# Patient Record
Sex: Female | Born: 1965 | Race: Black or African American | Hispanic: No | Smoking: Never smoker
Health system: Southern US, Community
[De-identification: ages and names within clinical notes are randomized; demographics above are authoritative.]

## PROBLEM LIST (undated history)

## (undated) DIAGNOSIS — N200 Calculus of kidney: Secondary | ICD-10-CM

## (undated) HISTORY — PX: ABDOMINAL HYSTERECTOMY: SHX81

---

## 1999-06-23 ENCOUNTER — Other Ambulatory Visit: Admission: RE | Admit: 1999-06-23 | Discharge: 1999-06-23 | Payer: Self-pay | Admitting: Obstetrics and Gynecology

## 1999-07-31 ENCOUNTER — Ambulatory Visit (HOSPITAL_COMMUNITY): Admission: RE | Admit: 1999-07-31 | Discharge: 1999-07-31 | Payer: Self-pay | Admitting: Obstetrics and Gynecology

## 1999-07-31 ENCOUNTER — Encounter (INDEPENDENT_AMBULATORY_CARE_PROVIDER_SITE_OTHER): Payer: Self-pay

## 2000-06-24 ENCOUNTER — Other Ambulatory Visit: Admission: RE | Admit: 2000-06-24 | Discharge: 2000-06-24 | Payer: Self-pay | Admitting: Obstetrics & Gynecology

## 2001-03-03 ENCOUNTER — Other Ambulatory Visit: Admission: RE | Admit: 2001-03-03 | Discharge: 2001-03-03 | Payer: Self-pay | Admitting: Obstetrics and Gynecology

## 2001-03-18 ENCOUNTER — Other Ambulatory Visit: Admission: RE | Admit: 2001-03-18 | Discharge: 2001-03-18 | Payer: Self-pay | Admitting: Obstetrics and Gynecology

## 2001-03-18 ENCOUNTER — Encounter (INDEPENDENT_AMBULATORY_CARE_PROVIDER_SITE_OTHER): Payer: Self-pay | Admitting: Specialist

## 2001-04-04 ENCOUNTER — Other Ambulatory Visit: Admission: RE | Admit: 2001-04-04 | Discharge: 2001-04-04 | Payer: Self-pay | Admitting: Obstetrics and Gynecology

## 2001-07-06 ENCOUNTER — Inpatient Hospital Stay (HOSPITAL_COMMUNITY): Admission: AD | Admit: 2001-07-06 | Discharge: 2001-07-06 | Payer: Self-pay | Admitting: Obstetrics and Gynecology

## 2001-07-07 ENCOUNTER — Inpatient Hospital Stay (HOSPITAL_COMMUNITY): Admission: AD | Admit: 2001-07-07 | Discharge: 2001-07-07 | Payer: Self-pay | Admitting: Obstetrics and Gynecology

## 2001-09-07 ENCOUNTER — Inpatient Hospital Stay (HOSPITAL_COMMUNITY): Admission: AD | Admit: 2001-09-07 | Discharge: 2001-09-07 | Payer: Self-pay | Admitting: Obstetrics and Gynecology

## 2001-09-13 ENCOUNTER — Inpatient Hospital Stay (HOSPITAL_COMMUNITY): Admission: AD | Admit: 2001-09-13 | Discharge: 2001-09-15 | Payer: Self-pay | Admitting: Obstetrics and Gynecology

## 2001-10-19 ENCOUNTER — Other Ambulatory Visit: Admission: RE | Admit: 2001-10-19 | Discharge: 2001-10-19 | Payer: Self-pay | Admitting: Obstetrics and Gynecology

## 2002-10-30 ENCOUNTER — Other Ambulatory Visit: Admission: RE | Admit: 2002-10-30 | Discharge: 2002-10-30 | Payer: Self-pay | Admitting: Obstetrics & Gynecology

## 2003-11-06 ENCOUNTER — Other Ambulatory Visit: Admission: RE | Admit: 2003-11-06 | Discharge: 2003-11-06 | Payer: Self-pay | Admitting: Obstetrics & Gynecology

## 2004-05-29 ENCOUNTER — Encounter: Admission: RE | Admit: 2004-05-29 | Discharge: 2004-05-29 | Payer: Self-pay | Admitting: Obstetrics and Gynecology

## 2005-06-16 ENCOUNTER — Encounter: Admission: RE | Admit: 2005-06-16 | Discharge: 2005-06-16 | Payer: Self-pay | Admitting: Obstetrics and Gynecology

## 2005-08-09 ENCOUNTER — Emergency Department: Payer: Self-pay | Admitting: Emergency Medicine

## 2006-07-22 ENCOUNTER — Encounter: Admission: RE | Admit: 2006-07-22 | Discharge: 2006-07-22 | Payer: Self-pay | Admitting: Obstetrics and Gynecology

## 2006-09-29 ENCOUNTER — Inpatient Hospital Stay (HOSPITAL_COMMUNITY): Admission: RE | Admit: 2006-09-29 | Discharge: 2006-10-02 | Payer: Self-pay | Admitting: Obstetrics and Gynecology

## 2006-09-29 ENCOUNTER — Encounter (INDEPENDENT_AMBULATORY_CARE_PROVIDER_SITE_OTHER): Payer: Self-pay | Admitting: Specialist

## 2006-10-11 ENCOUNTER — Encounter: Admission: RE | Admit: 2006-10-11 | Discharge: 2006-10-11 | Payer: Self-pay | Admitting: *Deleted

## 2007-09-22 ENCOUNTER — Encounter: Admission: RE | Admit: 2007-09-22 | Discharge: 2007-09-22 | Payer: Self-pay | Admitting: Obstetrics and Gynecology

## 2008-10-22 ENCOUNTER — Encounter: Admission: RE | Admit: 2008-10-22 | Discharge: 2008-10-22 | Payer: Self-pay | Admitting: Obstetrics and Gynecology

## 2011-02-27 NOTE — Op Note (Signed)
NAME:  Amy Cortez, Amy Cortez NO.:  [id number]   MEDICAL RECORD NO.:  [id number]          PATIENT TYPE:  INP   LOCATION:  9312                          FACILITY:  [facility]   PHYSICIAN:  Maxie Better, M.D.DATE OF BIRTH:  09-09-1966   DATE OF PROCEDURE:  09/29/2006  DATE OF DISCHARGE:                               OPERATIVE REPORT   PREOPERATIVE DIAGNOSIS:  Menorrhagia.   POSTOPERATIVE DIAGNOSIS:  Menorrhagia.   PROCEDURE:  Exploratory laparotomy, total abdominal hysterectomy.   ANESTHESIA:  General.   SURGEON:  Maxie Better, M.D.   ASSISTANT:  Genia Del, M.D.   DESCRIPTION OF PROCEDURE:  Under adequate general anesthesia, the  patient was placed in the supine position.  She was sterilely prepped  and draped in the usual fashion.  An indwelling Foley catheter was  sterilely placed.  The vagina had also been prepped.  0.25% Marcaine was  injected along the planned Pfannenstiel skin incision.  A Pfannenstiel  skin incision was then made and carried down to the rectus fascia.  The  rectus fascia was opened transversely.  The rectus fascia was then  bluntly and sharply dissected off the rectus muscle in a superior to  inferior fashion.  At the under surface of the rectus fascia superiorly,  a perforator was which was bleeding was cauterized.  Multiple  cauterization with resultant notable bleeding continued and 2-0 Vicryl  figure-of-eight suture was then placed for hemostasis.  The rectus  muscle was split in the midline.  The parietal peritoneum was entered  bluntly and extended.  The upper abdomen was explored.  Normal liver  edge was palpable. Normal kidneys.  The bowel was packed upwardly and a  self-retaining Balfour retractor was then placed. The uterus was normal  but globular.   Using Kelly clamps, the utero-ovarian ligaments were then bilaterally  clamped and traction was placed on the uterus.  No evidence of  endometriosis was noted.  The  ovaries and tubes were, otherwise, normal.  The right round ligament was then suture ligated with 0 Vicryl, cut with  cautery, with the anterior leaf of the broad ligament opened with  cautery and the posterior leaf of the broad ligament subsequently  opened.  The right utero-ovarian ligament was secondarily clamped, cut,  free tied with 0 Vicryl, and suture ligated with a 0 Vicryl.  The  uterine vessels were then skeletonized.  At that point, increased  bleeding was noted.  The pedicle was inspected.  Small bleeding along  the edges were cauterized.  The bleeding appeared to also be coming from  the ascending branches of the uterine vessel and the anterior cul-de-sac  was opened further transversely.  The bladder was then displaced  inferiorly by blunt dissection.  Below the bleeding sites of the uterine  vessels, these were doubly clamped, cut, suture ligated with 0 Vicryl.  The bleeding continued and subsequently, the cardinal ligaments were  clamped, cut and suture ligated with 0 Vicryl.  The attempts at  identifying the bleeding on the right pelvic area was seen.  Ultimately,  a bleeder was noted, clamped,  cauterized or free tied for hemostasis.   Attention was then turned to the contralateral side where the round  ligament was isolated, figure-of-eight 0 Vicryl suture was placed.  The  cautery was then used to the separate the round ligaments attachment.  The remaining anterior leaf of the broad ligament was opened  transversely and the bladder further displaced inferiorly.  The ureter  was noted on the left.  The posterior leaf of the broad ligament was  opened.  The utero-ovarian ligament on the left was then doubly clamped,  cut, free tied with 0 Vicryl figure-of-eight suture was then placed.  At  that point, the same issue of bleeding was then also noted. The uterine  vessels were clamped, cut and suture ligated with 0 Vicryl.  A second  clamp was then placed, cut, and suture  ligated with 0 Vicryl.  Bleeding  again was noted. The pedicle was retracted from the clamp on the left  and a suture was placed and this stitch secured. It was then noted that  pedicle was not also hemostased at that point, careful clamping was then  performed in order to secure bleeders and at that point the left ureter  was checked on again.  The left retroperitoneal space was opened with  the cautery and bleeding started.  Continued dissection in the  retroperitoneal space was then carefully done. The ureter was not  clearly seen on the medial border of the peritoneum but could be  palpated deep in the pelvis.  At that point, it was felt secure to ligate the bleeding vessels for  better exposure. At that point, the fundus of the uterus was truncated  from its attachment to the cervix and the truncated cervix was then  grasped and the bladder was pushed further inferiorly.  The uterosacral  ligaments was finally identified bilaterally, then they were both  clamped, cut and suture ligated with 0 Vicryl.  The cardinal ligaments  were then continued to be serially clamped, cut, intermittent bleeding  here and there, from every site that had been touched continued to  bleed.  At that point, a CBC and a DIC panel was obtained  intraoperatively and hold clot was also ordered. The cardinal ligaments  were then serially clamped, cut, and suture ligated with 0 Vicryl until  the cervicovaginal junction was then reached at which point the curved  Heaney was then placed across the cervicovaginal junction and the cervix  was then severed from its attachment.  The angle sutures were then  secured with Heaney suture of 0 Vicryl.  The vaginal cuff was bleeding  and 0 Vicryl running locked stitch was placed posteriorly and anteriorly  on the cuff.  Additional bleeders were still noted despite the reefing  of the cuff and additional individual 0 Vicryl figure-of-eight sutures were then placed at both  sites.  Once this was secured, there was some  bleeding noted on the bladder surface which were carefully cauterized or  clamped and suture ligated with 3-0 Vicryl suture.  The left aspect of  the bladder was noted to be tucked up near the left vaginal cuff at  which time that was separated.The bladder was inspected and was normal.  Bleeding from the vaginal cuff was encountered and eventually the  bleeding was secured.  The vaginal cuff was then closed with interrupted  0 Vicryl figure-of-eight sutures.  The ureter was once again  reidentified and continued to be peristalsing. Indigo carmine was  injected intraoperatively  during the time of the recessed portion of the  pedicle on the left. No intraabdominal dye was noted. The  retroperitoneal space on the left was reinspected for bleeders.  There  was small bleeding along  the edge the peritoneal surface at which time  a free tie of 0 Vicryl was performed.  The small bleeder that was noted  earlier near the pedicle where the uterine vessels was also noted to be  still slightly bleeding and, therefore, was isolated and re-clamped and  ligated.  Good hemostasis was finally accomplished.  The uterosacral was  then attached to the angle of the vaginal cuff bilaterally and the right  ovarian pedicle was suspended with the right round ligament, the left  ovary was not suspended.   The abdomen was then irrigated and suctioned of debris.  Reinspection  showed good hemostasis.  Intercede was then placed overlying the vaginal  cuff. The packings were then removed. The self-retaining retractor was  removed.  The parietal peritoneum was not closed.  The under surface of  the rectus fascia was inspected and good hemostasis noted.  The rectus  fascia was closed with 0 Vicryl x2.  The subcutaneous area was  irrigated, small bleeders cauterized, and 2-0 plain suture was then  placed interrupted. The skin was then approximating with Ethicon  staples.   The specimen was uterus with cervix sent to pathology.  Estimated blood loss was 1100 mL.  Intraoperative fluid was 3100 mL  crystalloid.  Urine output was 600 mL of urine, some of which was blue  as a result of indigo carmine. Sponge and instrument counts x2 was  correct.  There were no complications.  The patient tolerated the  procedure well and was transferred to recovery in stable condition.     Maxie Better, M.D.  Electronically Signed    [place]/MEDQ  D:  09/29/2006  T:  09/29/2006  Job:  [id number]

## 2011-02-27 NOTE — Discharge Summary (Signed)
NAME:  Amy Cortez, Amy Cortez NO.:  [id number]   MEDICAL RECORD NO.:  [id number]          PATIENT TYPE:  INP   LOCATION:  9312                          FACILITY:  [facility]   PHYSICIAN:  Maxie Better, M.D.DATE OF BIRTH:  1966-07-23   DATE OF ADMISSION:  09/29/2006  DATE OF DISCHARGE:  10/02/2006                               DISCHARGE SUMMARY   ADMISSION DIAGNOSIS:  Menorrhagia.   DISCHARGE DIAGNOSIS:  Menorrhagia, leiomyoma.   HOSPITAL COURSE:  The patient is a 45 year old gravida 2, para 2 female  who was admitted on September 29, 2006 to [facility] for  exploratory laparotomy, total abdominal hysterectomy.  The patient  underwent a total abdominal hysterectomy complicated by blood loss.  The  patient had postoperative anemia, mild, for which she remained  asymptomatic throughout her course.  The patient has slow bowel  function; however, by postoperative day #3, she had a bowel movement,  passed flatus, is tolerating a regular diet.  Incision showed no  evidence of an infection.  Her pathology was consistent with a benign  leiomyoma, 108 gm uterus.  The patient had a CBC on postop day #1 that  showed a hemoglobin of 9.3, hematocrit 26.3, white count 15, platelet  count 247,000.  Due to increased bleeding, the patient had a DIC panel  done in the operating room, which is notable for a low-normal  fibrinogen.  The patient is deemed well enough to be discharged home.   DISPOSITION:  Home.   CONDITION:  Stable.   DISCHARGE INSTRUCTIONS:  Call for temperature greater than or equal to  100.4, nothing per vagina for 4-6 weeks.  No heavy lifting or driving  for two weeks.  Call for severe abdominal pain, nausea or vomiting.  Call for increased incisional pain, drainage, or increased pain from the  incision site.   DISCHARGE MEDICATIONS:  1. Tylox #30 1-2 tablets every 3-4 hours p.r.n. pain.  2. Motrin 800 mg p.o. q.6h. p.r.n. pain.  3. Nu-Iron 150 mg p.o.  b.i.d.   FOLLOW-UP APPOINTMENTS:  [facility] in six weeks.  Staples to be  removed in the office.      Maxie Better, M.D.  Electronically Signed     [place]/MEDQ  D:  10/02/2006  T:  10/02/2006  Job:  [id number]

## 2011-02-27 NOTE — H&P (Signed)
[facility]  Patient:    Amy Cortez, BIRKEL Visit Number: [id number] MRN: [id number]          Service Type: Attending:  Sheronette A. Cherly Hensen, M.D. Dictated by:   Sheria Lang. Cherly Hensen, M.D. Adm. Date:  09/12/01                           History and Physical  CHIEF COMPLAINT:              Rapid labor.  HISTORY OF PRESENT ILLNESS:   This is a 45 year old gravida 2 para 1, 0-0-1, female with an LMP of December 20, 2000, [facility] of September 27, 2001, now at 38+ weeks gestation, admitted for induction of labor secondary to prior rapid labor.  The patients prenatal course has been notable for preterm cervical change, for which the patient was placed on bed rest.  She was given betamethasone for acceleration of fetal lung maturity.  The betamethasone was given July 07, 2001 through July 08, 2001.  Last examination was notable for the patient being about 3 cm, 50%, -2 vertex presentation.  Her last delivery was in December 1996, at which time she had a two hour labor without much sensation of contractions.  The patient has had persistent size greater than dates, with a normal one hour GCT and an ultrasound that consistently showed normal amniotic fluid and average gestational age for the fetus.  Prenatal care was at [facility].  Primary obstetrician is Sheronette A. Cherly Hensen, M.D.  Blood type is A-positive.  Antibody screen is negative.  Hemoglobin electrophoresis was negative.  RPR nonreactive.  Rubella immune.  Hepatitis B surface antigen negative.  HIV test nonreactive.  GC and Chlamydia cultures negative.  Pap smear normal.  AFP-3, patient declined. Sonogram on July 30, 202 at 20.3 weeks showed normal anatomic fetal survey and placenta that was posterior.  Normal one hour GCT.  The patient declined amniocentesis for advanced maternal age.  Ultrasound on July 27, 2001 for size greater than dates showed estimated fetal weight of 1727 g, which was  at the 36th percentile, normal amniotic fluid index.  ALLERGIES:                    No known drug allergies.  MEDICATIONS:                  Prenatal vitamins.  PAST MEDICAL HISTORY:         Fibroid uterus.  PAST SURGICAL HISTORY:        Endometriosis in October 2002.  PAST OBSTETRICAL HISTORY:     December 1996, 6 pound 7 ounce baby, two hour labor, vaginal delivery.  FAMILY HISTORY:               Maternal grandfather died of colon cancer. Diabetes in maternal grandmother.  SOCIAL HISTORY:               Married.  Nonsmoker.  Data financial.  One child.  REVIEW OF SYSTEMS:            Negative except for irregular contractions and pelvic pressure.  PHYSICAL EXAMINATION:  GENERAL:                      Gravid black female, in no acute distress.  VITAL SIGNS:                  Blood pressure 120/70.  SKIN:  No lesions.  HEENT:                        Sclerae anicteric.  Conjunctivae pink. Oropharynx negative.  HEART:                        Regular rate and rhythm without murmur.  LUNGS:                        Clear to auscultation.  BREAST:                       Soft, nontender.  No palpable mass.  ABDOMEN:                      Gravid.  Fundal height 41 cm.  PELVIC:                       Examination as per  HPI.  IMPRESSION:                   1. Term gestation.                               2. History of fast labor.                               3. Group B streptococci culture positive.  PLAN:                         1. Admission.                               2. Pitocin induction.                               3. Penicillin prophylaxis.                               4. Amniotomy p.r.n.                               5. Epidural p.r.n. Dictated by:   Sheria Lang. Cherly Hensen, M.D. Attending:  Sheronette A. Cherly Hensen, M.D. DD:  09/12/01 TD:  09/13/01 Job: [id number]

## 2011-02-27 NOTE — Consult Note (Signed)
[facility]  Patient:    Amy Cortez, AREOLA Visit Number: [id number] MRN: [id number]          Service Type: OBS Location: [facility] Attending Physician:  Lenoard Aden Dictated by:   Lenoard Aden, M.D. Proc. Date: 07/07/01 Admit Date:  07/07/2001   CC:         [facility]   Consultation Report  ER CONSULTATION NOTE  CHIEF COMPLAINT:  Decreased fetal movement.  HISTORY OF PRESENT ILLNESS:  Patient is a 45 year old African-American female, G2, P1, EDD September 27, 2001, at 28+ weeks gestation who reports decreased fetal movement.  She reports no leakage of fluid, no regular contractions, and no bleeding.  PAST MEDICAL HISTORY:  Remarkable for uncomplicated vaginal delivery in 1996, No known drug allergies.  Medications - prenatal vitamins.  History of myomectomy in October 2001 and endometriosis in October 2001.  History of kidney stones in 1992.  Family history of cardiovascular disease, diabetes, and chronic hypertension.  GYN REVIEW OF SYSTEMS:  Otherwise negative.  Patient denies domestic or physical violence.  Nonsmoker, nondrinker.  PRENATAL LAB DATA:  Blood type A positive, Rh antibody negative.  Rubella immune.  Hepatitis B surface antigen negative.  HIV nonreactive.  GC, Chlamydia negative.  PRENATAL LAB COURSE:  Uncomplicated at this time except for preterm cervical change and patient is for second dose of steroid therapy today.  PHYSICAL EXAMINATION:  GENERAL:  She is a well-developed, well-nourished black female in no apparent distress.  VITAL SIGNS:  Stable.  Patient is afebrile.  HEENT:  Normal.  LUNGS:  Clear.  HEART:  Regular rhythm.  ABDOMEN:  Soft, gravid, nontender.  PELVIC:  Exam is deferred to the office where it was performed yesterday revealing the cervix to be 1 cm dilated, 50% vertex, -2.  NST today is reactive with a baseline in the 140s and multiple accelerations in the 150 to 160 per minute  range.  No contractions are noted.  EXTREMITIES:  No cords.  NEUROLOGIC:  Nonfocal.  IMPRESSION: 1. A 28-week intrauterine pregnancy. 2. Preterm cervical change. 3. Reassuring fetal heart rate surveillance with reactive NST and no    contractions.  PLAN:  Betamethasone 12.5 mg IM, second dose given.  Fetal activity discussed, reassurance given.  Follow up in the office within one week.  Strict preterm labor warnings and bed rest discussed. Dictated by:   Lenoard Aden, M.D. Attending Physician:  Lenoard Aden DD:  07/07/01 TD:  07/07/01 Job: [id number]

## 2016-01-21 ENCOUNTER — Emergency Department (HOSPITAL_COMMUNITY)
Admission: EM | Admit: 2016-01-21 | Discharge: 2016-01-22 | Disposition: A | Discharge status: Home / Self Care | Payer: Managed Care, Other (non HMO) | Attending: Emergency Medicine | Admitting: Emergency Medicine

## 2016-01-21 ENCOUNTER — Encounter (HOSPITAL_COMMUNITY): Payer: Self-pay | Admitting: *Deleted

## 2016-01-21 DIAGNOSIS — R0789 Other chest pain: Secondary | ICD-10-CM | POA: Insufficient documentation

## 2016-01-21 DIAGNOSIS — R112 Nausea with vomiting, unspecified: Secondary | ICD-10-CM | POA: Insufficient documentation

## 2016-01-21 DIAGNOSIS — R42 Dizziness and giddiness: Secondary | ICD-10-CM | POA: Insufficient documentation

## 2016-01-21 DIAGNOSIS — Z87442 Personal history of urinary calculi: Secondary | ICD-10-CM | POA: Insufficient documentation

## 2016-01-21 DIAGNOSIS — R0602 Shortness of breath: Secondary | ICD-10-CM | POA: Insufficient documentation

## 2016-01-21 HISTORY — DX: Calculus of kidney: N20.0

## 2016-01-21 LAB — COMPREHENSIVE METABOLIC PANEL
ALT: 16 U/L (ref 14–54)
AST: 25 U/L (ref 15–41)
Albumin: 3.8 g/dL (ref 3.5–5.0)
Alkaline Phosphatase: 59 U/L (ref 38–126)
Anion gap: 10 (ref 5–15)
BILIRUBIN TOTAL: 0.8 mg/dL (ref 0.3–1.2)
CO2: 25 mmol/L (ref 22–32)
CREATININE: 0.72 mg/dL (ref 0.44–1.00)
Calcium: 9.3 mg/dL (ref 8.9–10.3)
Chloride: 108 mmol/L (ref 101–111)
GFR calc Af Amer: >60 mL/min (ref >60)
Glucose, Bld: 102 mg/dL — ABNORMAL HIGH (ref 65–99)
POTASSIUM: 3.9 mmol/L (ref 3.5–5.1)
Sodium: 143 mmol/L (ref 135–145)
Total Protein: 7.5 g/dL (ref 6.5–8.1)

## 2016-01-21 LAB — URINALYSIS, ROUTINE W REFLEX MICROSCOPIC
BILIRUBIN URINE: NEGATIVE
Glucose, UA: NEGATIVE mg/dL
HGB URINE DIPSTICK: NEGATIVE
Ketones, ur: NEGATIVE mg/dL
Leukocytes, UA: NEGATIVE
Nitrite: NEGATIVE
Protein, ur: NEGATIVE mg/dL
SPECIFIC GRAVITY, URINE: 1.013 (ref 1.005–1.030)
pH: 7.5 (ref 5.0–8.0)

## 2016-01-21 LAB — LIPASE, BLOOD: Lipase: 22 U/L (ref 11–51)

## 2016-01-21 LAB — CBC
HCT: 38.6 % (ref 36.0–46.0)
HEMOGLOBIN: 12.6 g/dL (ref 12.0–15.0)
MCH: 29.6 pg (ref 26.0–34.0)
MCHC: 32.6 g/dL (ref 30.0–36.0)
MCV: 90.8 fL (ref 78.0–100.0)
PLATELETS: 208 10*3/uL (ref 150–400)
RBC: 4.25 MIL/uL (ref 3.87–5.11)
RDW: 13.6 % (ref 11.5–15.5)
WBC: 6.5 10*3/uL (ref 4.0–10.5)

## 2016-01-21 LAB — I-STAT TROPONIN, ED: TROPONIN I, POC: 0 ng/mL (ref 0.00–0.08)

## 2016-01-21 NOTE — ED Provider Notes (Signed)
CSN: [id number]     Arrival date & time 01/21/16  1538 History   By signing my name below, I, Amy Cortez, attest that this documentation has been prepared under the direction and in the presence of Shon Batonourtney F Johari Pinney, MD.  Electronically Signed: Arlan OrganAshley Cortez, ED Scribe. 01/22/2016. 12:12 AM.   Chief Complaint  Patient presents with  . Abdominal Pain  . Chest Pain   The history is provided by the patient. No language interpreter was used.    HPI Comments: Amy Cortez brought in by EMS is a 50 y.o. female without any pertinent past medical history who presents to the Emergency Department complaining of intermittent, ongoing L sided chest pain x 2 weeks. Pain is described as pressure/dull. No aggravating or alleviating factors. Pt also reports intermittent shortness of breath x few months. She reports intermittent, ongoing nausea, vomiting, and dizziness x 1 day. Pt states she ate some [employer] chicken for lunch at approximately 12:30 PM and experienced several episode of vomiting afterwards. No OTC medications or home remedies attempted prior to arrival. No recent fever, chills, or abdominal pain. Last cardiac stress test in December of 2016 And per the patient was normal. Ms. Amy Cortez admits her daughter was recently sick at home with Flu like symptoms. Pt with known allergy to Celebrex.  PCP: No PCP Per Patient    Past Medical History  Diagnosis Date  . Kidney stones    Past Surgical History  Procedure Laterality Date  . Abdominal hysterectomy     No family history on file. Social History  Substance Use Topics  . Smoking status: Never Smoker   . Smokeless tobacco: None  . Alcohol Use: No   OB History    No data available     Review of Systems  Constitutional: Negative for fever and chills.  Respiratory: Positive for shortness of breath.   Cardiovascular: Positive for chest pain.  Gastrointestinal: Positive for nausea and vomiting. Negative for abdominal pain.   Neurological: Positive for dizziness. Negative for headaches.  All other systems reviewed and are negative.     Allergies  Celebrex  Home Medications   Prior to Admission medications   Medication Sig Start Date End Date Taking? Authorizing Provider  ondansetron (ZOFRAN ODT) 4 MG disintegrating tablet Take 1 tablet (4 mg total) by mouth every 8 (eight) hours as needed for nausea or vomiting. 01/22/16   Shon Batonourtney F Dajai Wahlert, MD   Triage Vitals: BP 116/66 mmHg  Pulse 77  Temp(Src) 97.6 F (36.4 C) (Oral)  Resp 16  Ht 5' 5.5" (1.664 m)  SpO2 100%   Physical Exam  Constitutional: She is oriented to person, place, and time. She appears well-developed and well-nourished. No distress.  HENT:  Head: Normocephalic and atraumatic.  Eyes: EOM are normal. Pupils are equal, round, and reactive to light.  Neck: Neck supple.  Cardiovascular: Normal rate, regular rhythm and normal heart sounds.   No murmur heard. Pulmonary/Chest: Effort normal and breath sounds normal. No respiratory distress. She has no wheezes.  Abdominal: Soft. Bowel sounds are normal. There is no tenderness. There is no rebound.  Neurological: She is alert and oriented to person, place, and time.  Cranial nerves II through XII intact, 5 out of 5 strength in all 4 extremities, no dysmetria to finger-nose-finger  Skin: Skin is warm and dry.  Psychiatric: She has a normal mood and affect.  Nursing note and vitals reviewed.   ED Course  Procedures (including critical care time)  DIAGNOSTIC STUDIES: Oxygen Saturation is 100% on RA, Normal by my interpretation.    COORDINATION OF CARE: 12:04 AM- Will order imaging, blood work, urinalysis, and EKG. Will give fluids and Zofran. Discussed treatment plan with pt at bedside and pt agreed to plan.     Labs Review Labs Reviewed  COMPREHENSIVE METABOLIC PANEL - Abnormal; Notable for the following:    Glucose, Bld 102 (*)    BUN <5 (*)    All other components within normal  limits  LIPASE, BLOOD  CBC  URINALYSIS, ROUTINE W REFLEX MICROSCOPIC (NOT AT [facility])  D-DIMER, QUANTITATIVE (NOT AT [facility])  Rosezena Sensor, ED  Rosezena Sensor, ED    Imaging Review Dg Abd Acute W/chest  01/22/2016  CLINICAL DATA:  Vomiting, weakness and lightheadedness today. EXAM: DG ABDOMEN ACUTE W/ 1V CHEST COMPARISON:  None. FINDINGS: There is no evidence of dilated bowel loops or free intraperitoneal air. No radiopaque calculi or other significant radiographic abnormality is seen. Heart size and mediastinal contours are within normal limits. Both lungs are clear. IMPRESSION: Negative abdominal radiographs.  No acute cardiopulmonary disease. Electronically Signed   By: Ellery Plunk M.D.   On: 01/22/2016 01:22   I have personally reviewed and evaluated these images and lab results as part of my medical decision-making.   EKG Interpretation   Date/Time:  Tuesday January 21 2016 15:55:11 EDT Ventricular Rate:  68 PR Interval:  156 QRS Duration: 80 QT Interval:  408 QTC Calculation: 433 R Axis:   30 Text Interpretation:  Normal sinus rhythm Cannot rule out Anterior infarct  , age undetermined Abnormal ECG No prior for comparison Confirmed by  Fielding Mault  MD, Toni Amend ([id number]) on 01/21/2016 11:51:16 PM      MDM   Final diagnoses:  Non-intractable vomiting with nausea, vomiting of unspecified type  Dizziness  Other chest pain    Patient presents with multiple complaints. Dizziness and vomiting earlier today after eating lunch. Multiple weeks of chest pain. Reports recent cardiac stress test was reassuring. She is low risk for ACS.  EKG shows no evidence of acute ischemia. Chest x-ray is reassuring. D-dimer is negative. Troponin 2 is reassuring. Patient does not appear orthostatic; however, given the vomiting, she could be mildly dehydrated causing some dizziness. She is nonfocal and doubt central cause of vertigo. On recheck, patient states she feels improved after hydration. She  is able to tolerate fluids without difficulty. Discussed with patient follow-up with primary physician and cardiology if chest pain persists.  After history, exam, and medical workup I feel the patient has been appropriately medically screened and is safe for discharge home. Pertinent diagnoses were discussed with the patient. Patient was given return precautions.  I personally performed the services described in this documentation, which was scribed in my presence. The recorded information has been reviewed and is accurate.   Shon Baton, MD 01/22/16 [phone]

## 2016-01-21 NOTE — ED Notes (Signed)
[facility], per report pt has had intermittent L sided CP onset x 2 wks, pt c/o n/v, & dizziness onset x 3 hrs, pt A&O x 4, follows commands, speaks in complete sentences

## 2016-01-22 ENCOUNTER — Emergency Department (HOSPITAL_COMMUNITY): Payer: Managed Care, Other (non HMO)

## 2016-01-22 LAB — D-DIMER, QUANTITATIVE: D-Dimer, Quant: 0.35 ug/mL-FEU (ref 0.00–0.50)

## 2016-01-22 LAB — I-STAT TROPONIN, ED: TROPONIN I, POC: 0 ng/mL (ref 0.00–0.08)

## 2016-01-22 MED ORDER — ONDANSETRON HCL 4 MG/2ML IJ SOLN
4.0000 mg | Freq: Once | INTRAMUSCULAR | Status: AC
  Administered 2016-01-22: 4 mg via INTRAVENOUS
  Filled 2016-01-22: qty 2

## 2016-01-22 MED ORDER — SODIUM CHLORIDE 0.9 % IV BOLUS (SEPSIS)
1000.0000 mL | Freq: Once | INTRAVENOUS | Status: AC
  Administered 2016-01-22: 1000 mL via INTRAVENOUS

## 2016-01-22 MED ORDER — ONDANSETRON 4 MG PO TBDP: 4.0000 mg | ORAL_TABLET | Freq: Three times a day (TID) | ORAL | Status: DC | PRN

## 2016-01-22 NOTE — Discharge Instructions (Signed)
You were seen today for multiple complaints including nausea, vomiting, dizziness, and chest pain. Your workup is reassuring. You need follow-up with her primary physician and cardiology if symptoms persist.   Nonspecific Chest Pain  Chest pain can be caused by many different conditions. There is always a chance that your pain could be related to something serious, such as a heart attack or a blood clot in your lungs. Chest pain can also be caused by conditions that are not life-threatening. If you have chest pain, it is very important to follow up with your health care provider. CAUSES  Chest pain can be caused by:  Heartburn.  Pneumonia or bronchitis.  Anxiety or stress.  Inflammation around your heart (pericarditis) or lung (pleuritis or pleurisy).  A blood clot in your lung.  A collapsed lung (pneumothorax). It can develop suddenly on its own (spontaneous pneumothorax) or from trauma to the chest.  Shingles infection (varicella-zoster virus).  Heart attack.  Damage to the bones, muscles, and cartilage that make up your chest wall. This can include:  Bruised bones due to injury.  Strained muscles or cartilage due to frequent or repeated coughing or overwork.  Fracture to one or more ribs.  Sore cartilage due to inflammation (costochondritis). RISK FACTORS  Risk factors for chest pain may include:  Activities that increase your risk for trauma or injury to your chest.  Respiratory infections or conditions that cause frequent coughing.  Medical conditions or overeating that can cause heartburn.  Heart disease or family history of heart disease.  Conditions or health behaviors that increase your risk of developing a blood clot.  Having had chicken pox (varicella zoster). SIGNS AND SYMPTOMS Chest pain can feel like:  Burning or tingling on the surface of your chest or deep in your chest.  Crushing, pressure, aching, or squeezing pain.  Dull or sharp pain that is  worse when you move, cough, or take a deep breath.  Pain that is also felt in your back, neck, shoulder, or arm, or pain that spreads to any of these areas. Your chest pain may come and go, or it may stay constant. DIAGNOSIS Lab tests or other studies may be needed to find the cause of your pain. Your health care provider may have you take a test called an ambulatory ECG (electrocardiogram). An ECG records your heartbeat patterns at the time the test is performed. You may also have other tests, such as:  Transthoracic echocardiogram (TTE). During echocardiography, sound waves are used to create a picture of all of the heart structures and to look at how blood flows through your heart.  Transesophageal echocardiogram (TEE).This is a more advanced imaging test that obtains images from inside your body. It allows your health care provider to see your heart in finer detail.  Cardiac monitoring. This allows your health care provider to monitor your heart rate and rhythm in real time.  Holter monitor. This is a portable device that records your heartbeat and can help to diagnose abnormal heartbeats. It allows your health care provider to track your heart activity for several days, if needed.  Stress tests. These can be done through exercise or by taking medicine that makes your heart beat more quickly.  Blood tests.  Imaging tests. TREATMENT  Your treatment depends on what is causing your chest pain. Treatment may include:  Medicines. These may include:  Acid blockers for heartburn.  Anti-inflammatory medicine.  Pain medicine for inflammatory conditions.  Antibiotic medicine, if an infection is  present.  Medicines to dissolve blood clots.  Medicines to treat coronary artery disease.  Supportive care for conditions that do not require medicines. This may include:  Resting.  Applying heat or cold packs to injured areas.  Limiting activities until pain decreases. HOME CARE  INSTRUCTIONS  If you were prescribed an antibiotic medicine, finish it all even if you start to feel better.  Avoid any activities that bring on chest pain.  Do not use any tobacco products, including cigarettes, chewing tobacco, or electronic cigarettes. If you need help quitting, ask your health care provider.  Do not drink alcohol.  Take medicines only as directed by your health care provider.  Keep all follow-up visits as directed by your health care provider. This is important. This includes any further testing if your chest pain does not go away.  If heartburn is the cause for your chest pain, you may be told to keep your head raised (elevated) while sleeping. This reduces the chance that acid will go from your stomach into your esophagus.  Make lifestyle changes as directed by your health care provider. These may include:  Getting regular exercise. Ask your health care provider to suggest some activities that are safe for you.  Eating a heart-healthy diet. A registered dietitian can help you to learn healthy eating options.  Maintaining a healthy weight.  Managing diabetes, if necessary.  Reducing stress. SEEK MEDICAL CARE IF:  Your chest pain does not go away after treatment.  You have a rash with blisters on your chest.  You have a fever. SEEK IMMEDIATE MEDICAL CARE IF:   Your chest pain is worse.  You have an increasing cough, or you cough up blood.  You have severe abdominal pain.  You have severe weakness.  You faint.  You have chills.  You have sudden, unexplained chest discomfort.  You have sudden, unexplained discomfort in your arms, back, neck, or jaw.  You have shortness of breath at any time.  You suddenly start to sweat, or your skin gets clammy.  You feel nauseous or you vomit.  You suddenly feel light-headed or dizzy.  Your heart begins to beat quickly, or it feels like it is skipping beats. These symptoms may represent a serious  problem that is an emergency. Do not wait to see if the symptoms will go away. Get medical help right away. Call your local emergency services (911 in the [place] Do not drive yourself to the hospital.   This information is not intended to replace advice given to you by your health care provider. Make sure you discuss any questions you have with your health care provider.   Document Released: 07/08/2005 Document Revised: 10/19/2014 Document Reviewed: 05/04/2014 Elsevier Interactive Patient Education 2016 Else[employer] Inc[employer]   Nausea and Vomiting Nausea is a sick feeling that often comes before throwing up (vomiting). Vomiting is a reflex where stomach contents come out of your mouth. Vomiting can cause severe loss of body fluids (dehydration). Children and elderly adults can become dehydrated quickly, especially if they also have diarrhea. Nausea and vomiting are symptoms of a condition or disease. It is important to find the cause of your symptoms. CAUSES   Direct irritation of the stomach lining. This irritation can result from increased acid production (gastroesophageal reflux disease), infection, food poisoning, taking certain medicines (such as nonsteroidal anti-inflammatory drugs), alcohol use, or tobacco use.  Signals from the brain.These signals could be caused by a headache, heat exposure, an inner ear disturbance, increased pressure  in the brain from injury, infection, a tumor, or a concussion, pain, emotional stimulus, or metabolic problems.  An obstruction in the gastrointestinal tract (bowel obstruction).  Illnesses such as diabetes, hepatitis, gallbladder problems, appendicitis, kidney problems, cancer, sepsis, atypical symptoms of a heart attack, or eating disorders.  Medical treatments such as chemotherapy and radiation.  Receiving medicine that makes you sleep (general anesthetic) during surgery. DIAGNOSIS Your caregiver may ask for tests to be done if the problems do not improve  after a few days. Tests may also be done if symptoms are severe or if the reason for the nausea and vomiting is not clear. Tests may include:  Urine tests.  Blood tests.  Stool tests.  Cultures (to look for evidence of infection).  X-rays or other imaging studies. Test results can help your caregiver make decisions about treatment or the need for additional tests. TREATMENT You need to stay well hydrated. Drink frequently but in small amounts.You may wish to drink water, sports drinks, clear broth, or eat frozen ice pops or gelatin dessert to help stay hydrated.When you eat, eating slowly may help prevent nausea.There are also some antinausea medicines that may help prevent nausea. HOME CARE INSTRUCTIONS   Take all medicine as directed by your caregiver.  If you do not have an appetite, do not force yourself to eat. However, you must continue to drink fluids.  If you have an appetite, eat a normal diet unless your caregiver tells you differently.  Eat a variety of complex carbohydrates (rice, wheat, potatoes, bread), lean meats, yogurt, fruits, and vegetables.  Avoid high-fat foods because they are more difficult to digest.  Drink enough water and fluids to keep your urine clear or pale yellow.  If you are dehydrated, ask your caregiver for specific rehydration instructions. Signs of dehydration may include:  Severe thirst.  Dry lips and mouth.  Dizziness.  Dark urine.  Decreasing urine frequency and amount.  Confusion.  Rapid breathing or pulse. SEEK IMMEDIATE MEDICAL CARE IF:   You have blood or brown flecks (like coffee grounds) in your vomit.  You have black or bloody stools.  You have a severe headache or stiff neck.  You are confused.  You have severe abdominal pain.  You have chest pain or trouble breathing.  You do not urinate at least once every 8 hours.  You develop cold or clammy skin.  You continue to vomit for longer than 24 to 48  hours.  You have a fever. MAKE SURE YOU:   Understand these instructions.  Will watch your condition.  Will get help right away if you are not doing well or get worse.   This information is not intended to replace advice given to you by your health care provider. Make sure you discuss any questions you have with your health care provider.   Document Released: 09/28/2005 Document Revised: 12/21/2011 Document Reviewed: 02/25/2011 Elsevier Interactive Patient Education [employer]

## 2016-01-22 NOTE — ED Notes (Signed)
Pt stable, ambulatory, states understanding of discharge instructions 

## 2016-01-23 ENCOUNTER — Telehealth: Payer: Self-pay | Admitting: *Deleted

## 2016-01-23 NOTE — Telephone Encounter (Signed)
Patient recently seen in ED for nausea, vomiting, and chest pain She called for follow up appointment which was made by scheduling for May Spoke with patient and she stated she had been having chest pains off and on for a month or so Per ED note and patient she had a normal stress test end of 2016 at Dr Verl DickerGanji's office  Patient denies any history of HTN, diabetes, elevated cholesterol, or heart disease. No family h/o heart disease Patient stated several times she thought pain just secondary to stress and was feeling fine now Did recommend patient getting record from Dr Verl DickerGanji's office sent over  Patient stated she would call them and call back with what they told her regarding process of getting records

## 2016-02-21 ENCOUNTER — Encounter: Payer: Managed Care, Other (non HMO) | Admitting: Cardiovascular Disease

## 2016-02-21 NOTE — Progress Notes (Signed)
Patient ID: Amy Cortez, female   DOB: 1966-01-13, 50 y.o.   MRN: [id number] CARDIOLOGY CONSULT NOTE       Patient ID: Amy DoffingCora D Cortez MRN: [id number] DOB/AGE: 1966-01-13 49 y.o.  Admit date: (Not on file) Referring Physician:  Marcy SalvoBridgette Allison  Primary Physician: No PCP Per Patient Primary Cardiologist: New Reason for Consultation: Chest Pain  Active Problems:   * No active hospital problems. *   HPI:  50 y.o. referred for chest pain.  Seen by [facility] 02/04/16.  Seen by them October 2016 after URI. Normal ETT and thought to have chostrochondritisSeen in ER 01/21/16 upper abdominal pain And left sided chest pain for two weeks with associated nausea/vomiting.  Initiated by eating fried chicken with subsequent vomiting R/O and D/c home Still with fatigue Reviewed ER records and CXR NAD, troponin negative, normal d dimer and lipase and WBC.  01/22/16 Abdominal films non acute Has not had GBUS.    ROS All other systems reviewed and negative except as noted above  Past Medical History  Diagnosis Date  . Kidney stones     No family history on file.  Social History   Social History  . Marital Status: Married    Spouse Name: N/A  . Number of Children: N/A  . Years of Education: N/A   Occupational History  . Not on file.   Social History Main Topics  . Smoking status: Never Smoker   . Smokeless tobacco: Not on file  . Alcohol Use: No  . Drug Use: No  . Sexual Activity: Not on file   Other Topics Concern  . Not on file   Social History Narrative  . No narrative on file    Past Surgical History  Procedure Laterality Date  . Abdominal hysterectomy          Physical Exam: There were no vitals taken for this visit.      Labs:   Lab Results  Component Value Date   WBC 6.5 01/21/2016   HGB 12.6 01/21/2016   HCT 38.6 01/21/2016   MCV 90.8 01/21/2016   PLT 208 01/21/2016   No results for input(s): NA, K, CL, CO2, BUN, CREATININE, CALCIUM,  PROT, BILITOT, ALKPHOS, ALT, AST, GLUCOSE in the last 168 hours.  Invalid input(s): LABALBU No results found for: CKTOTAL, CKMB, CKMBINDEX, TROPONINI No results found for: CHOL No results found for: HDL No results found for: LDLCALC No results found for: TRIG No results found for: CHOLHDL No results found for: LDLDIRECT    Radiology: No results found.  EKG: SR rate 57 normal    ASSESSMENT AND PLAN:    Signed: Charlton Hawseter Bronislaus Verdell 02/21/2016, 9:31 AM    This encounter was created in error - please disregard.

## 2016-02-24 ENCOUNTER — Encounter: Payer: Self-pay | Admitting: Cardiovascular Disease

## 2017-10-25 ENCOUNTER — Other Ambulatory Visit: Payer: Self-pay

## 2017-10-25 ENCOUNTER — Encounter (HOSPITAL_COMMUNITY): Payer: Self-pay

## 2017-10-25 ENCOUNTER — Emergency Department (HOSPITAL_COMMUNITY): Payer: 59

## 2017-10-25 ENCOUNTER — Emergency Department (HOSPITAL_COMMUNITY)
Admission: EM | Admit: 2017-10-25 | Discharge: 2017-10-25 | Disposition: A | Discharge status: Home / Self Care | Payer: 59 | Attending: Emergency Medicine | Admitting: Emergency Medicine

## 2017-10-25 DIAGNOSIS — Z79899 Other long term (current) drug therapy: Secondary | ICD-10-CM | POA: Diagnosis not present

## 2017-10-25 DIAGNOSIS — R079 Chest pain, unspecified: Secondary | ICD-10-CM | POA: Insufficient documentation

## 2017-10-25 DIAGNOSIS — R0602 Shortness of breath: Secondary | ICD-10-CM | POA: Insufficient documentation

## 2017-10-25 LAB — I-STAT TROPONIN, ED
TROPONIN I, POC: 0 ng/mL (ref 0.00–0.08)
Troponin i, poc: 0 ng/mL (ref 0.00–0.08)
Troponin i, poc: 0 ng/mL (ref 0.00–0.08)

## 2017-10-25 LAB — D-DIMER, QUANTITATIVE: D-Dimer, Quant: 0.27 ug/mL-FEU (ref 0.00–0.50)

## 2017-10-25 LAB — CBC
HCT: 40.4 % (ref 36.0–46.0)
HEMOGLOBIN: 13.4 g/dL (ref 12.0–15.0)
MCH: 30.2 pg (ref 26.0–34.0)
MCHC: 33.2 g/dL (ref 30.0–36.0)
MCV: 91 fL (ref 78.0–100.0)
Platelets: 243 10*3/uL (ref 150–400)
RBC: 4.44 MIL/uL (ref 3.87–5.11)
RDW: 13.6 % (ref 11.5–15.5)
WBC: 5.3 10*3/uL (ref 4.0–10.5)

## 2017-10-25 LAB — BASIC METABOLIC PANEL
Anion gap: 8 (ref 5–15)
CALCIUM: 8.8 mg/dL — AB (ref 8.9–10.3)
CO2: 25 mmol/L (ref 22–32)
Chloride: 108 mmol/L (ref 101–111)
Creatinine, Ser: 0.6 mg/dL (ref 0.44–1.00)
GFR calc Af Amer: >60 mL/min (ref >60)
GLUCOSE: 78 mg/dL (ref 65–99)
Potassium: 3.7 mmol/L (ref 3.5–5.1)
SODIUM: 141 mmol/L (ref 135–145)

## 2017-10-25 LAB — I-STAT BETA HCG BLOOD, ED (MC, WL, AP ONLY): I-stat hCG, quantitative: 5.8 m[IU]/mL — ABNORMAL HIGH (ref <5)

## 2017-10-25 MED ORDER — IOPAMIDOL (ISOVUE-370) INJECTION 76%
INTRAVENOUS | Status: AC
  Administered 2017-10-25: 100 mL via INTRAVENOUS
  Filled 2017-10-25: qty 100

## 2017-10-25 MED ORDER — IBUPROFEN 800 MG PO TABS: 800.0000 mg | ORAL_TABLET | Freq: Three times a day (TID) | ORAL | 0 refills | Status: AC | PRN

## 2017-10-25 MED ORDER — KETOROLAC TROMETHAMINE 30 MG/ML IJ SOLN
30.0000 mg | Freq: Once | INTRAMUSCULAR | Status: AC
  Administered 2017-10-25: 30 mg via INTRAMUSCULAR
  Filled 2017-10-25: qty 1

## 2017-10-25 NOTE — ED Notes (Signed)
Lab will add on d-dimer 

## 2017-10-25 NOTE — ED Provider Notes (Signed)
[facility] Provider Note   CSN: [id number] Arrival date & time: 10/25/17  1234     History   Chief Complaint Chief Complaint  Patient presents with  . Chest Pain    HPI Amy DoffingCora D Cortez is a 52 y.o. female with history of nephrolithiasis presents today with chief complaint acute onset, intermittent right-sided chest pain for 1 week.  She states that when she awoke around 4 AM this morning the pain had increased in severity.  She states the pain awoke her.  Pain is right-sided occasionally radiates to the lower portion of the anterior chest, worsens with deep breaths and certain movements.  She describes the pain as a pressure pins and needle sensation.  She also endorses shortness of breath which worsens with exertion and fatigue which she states is abnormal for her.  She denies abdominal pain, nausea, vomiting, diaphoresis, cough, trauma, falls, fevers, chills, headache, or lightheadedness.  She has tried omega-3 tablets without significant relief of her symptoms.  She has not tried anything else for her symptoms.  She is a non-smoker, denies alcohol intake or excessive caffeine intake.  She states she has a significant family history of cardiac disease but has not herself.  Denies history of diabetes, hypertension, or high cholesterol, however states she has not seen a primary care physician in several years. Last cardiac stress test in December of 2016 And per the patient was normal. Has never had a stress test.    The history is provided by the patient.    Past Medical History:  Diagnosis Date  . Kidney stones     There are no active problems to display for this patient.   Past Surgical History:  Procedure Laterality Date  . ABDOMINAL HYSTERECTOMY      OB History    No data available       Home Medications    Prior to Admission medications   Medication Sig Start Date End Date Taking? Authorizing Provider  Cyanocobalamin (VITAMIN B  12 PO) Take 1,000 mg by mouth daily.   Yes [provider]  FISH OIL-KRILL OIL PO Take 1 capsule by mouth 3 (three) times a week.   Yes [provider]  TURMERIC PO Take 1 capsule by mouth daily.   Yes [provider]  vitamin E (VITAMIN E) 400 UNIT capsule Take 400 Units by mouth 3 (three) times a week.   Yes [provider]  Wild Yam, Dioscorea villosa, (WILD YAM PO) Take 1 tablet by mouth 3 (three) times a week.   Yes [provider]    Family History No family history on file.  Social History Social History   Tobacco Use  . Smoking status: Never Smoker  . Smokeless tobacco: Never Used  Substance Use Topics  . Alcohol use: No  . Drug use: No     Allergies   Celebrex [celecoxib]   Review of Systems Review of Systems  Constitutional: Positive for fatigue. Negative for chills and fever.  Respiratory: Positive for shortness of breath. Negative for cough.   Cardiovascular: Positive for chest pain. Negative for palpitations and leg swelling.  Gastrointestinal: Negative for abdominal pain, nausea and vomiting.  Neurological: Negative for light-headedness.  All other systems reviewed and are negative.    Physical Exam Updated Vital Signs BP 115/67   Pulse 62   Temp 98.1 F (36.7 C) (Oral)   Resp 10   Ht 5\' 6"  (1.676 m)   Wt 90.7  kg (200 lb)   SpO2 100%   BMI 32.28 kg/m   Physical Exam  Constitutional: She appears well-developed and well-nourished. No distress.  HENT:  Head: Normocephalic and atraumatic.  Eyes: Conjunctivae are normal. Right eye exhibits no discharge. Left eye exhibits no discharge.  Neck: Normal range of motion. Neck supple. No JVD present. No tracheal deviation present.  Cardiovascular: Normal rate and regular rhythm.  Pulmonary/Chest: Effort normal and breath sounds normal. No accessory muscle usage. No tachypnea.  Equal rise and fall of chest, no increased work of breathing, although mildly  tachypneic during my examination.  No chest wall tenderness anteriorly or posteriorly although pain elicited on the right side with deep inspiration and rotation.  No pain elicited with range of motion of the upper extremities.  Abdominal: Soft. Bowel sounds are normal. She exhibits no distension. There is no tenderness.  Musculoskeletal: She exhibits no edema.       Right lower leg: Normal. She exhibits no tenderness and no edema.       Left lower leg: Normal. She exhibits no tenderness and no edema.  Neurological: She is alert.  Skin: Skin is warm and dry. No erythema.  Psychiatric: She has a normal mood and affect. Her behavior is normal.  Nursing note and vitals reviewed.    ED Treatments / Results  Labs (all labs ordered are listed, but only abnormal results are displayed) Labs Reviewed  BASIC METABOLIC PANEL - Abnormal; Notable for the following components:      Result Value   BUN <5 (*)    Calcium 8.8 (*)    All other components within normal limits  I-STAT BETA HCG BLOOD, ED ([facility], [facility], [facility] ONLY) - Abnormal; Notable for the following components:   I-stat hCG, quantitative 5.8 (*)    All other components within normal limits  CBC  D-DIMER, QUANTITATIVE (NOT AT [facility])  I-STAT TROPONIN, ED  I-STAT TROPONIN, ED  I-STAT TROPONIN, ED    EKG  EKG Interpretation  Date/Time:  Monday October 25 2017 12:47:29 EST Ventricular Rate:  59 PR Interval:  150 QRS Duration: 84 QT Interval:  418 QTC Calculation: 413 R Axis:   7 Text Interpretation:  Sinus bradycardia Otherwise normal ECG no ischemic changes. no sig change from previous. Confirmed by Arby Barrette [phone]) on 10/25/2017 3:28:23 PM       Radiology Dg Chest 2 View  Result Date: 10/25/2017 CLINICAL DATA:  Right-sided chest pain for 1 week. EXAM: CHEST  2 VIEW COMPARISON:  None. FINDINGS: The heart size and mediastinal contours are within normal limits. Both lungs are clear. The visualized skeletal structures are  unremarkable. IMPRESSION: No active cardiopulmonary disease. Electronically Signed   By: Kennith Center M.D.   On: 10/25/2017 13:27   Ct Angio Chest Pe W And/or Wo Contrast  Result Date: 10/25/2017 CLINICAL DATA:  52 year old female with shortness of breath. EXAM: CT ANGIOGRAPHY CHEST WITH CONTRAST TECHNIQUE: Multidetector CT imaging of the chest was performed using the standard protocol during bolus administration of intravenous contrast. Multiplanar CT image reconstructions and MIPs were obtained to evaluate the vascular anatomy. CONTRAST:  ISOVUE-370 IOPAMIDOL (ISOVUE-370) INJECTION 76% COMPARISON:  Chest radiograph dated 10/25/2017 FINDINGS: Cardiovascular: There is no cardiomegaly or pericardial effusion. The thoracic aorta is unremarkable. There is no CT evidence of pulmonary embolism. Mediastinum/Nodes: No hilar or mediastinal adenopathy. There is a small hiatal hernia. The esophagus and the thyroid gland are grossly unremarkable. No mediastinal fluid collection. Lungs/Pleura: The lungs are clear. There  is no pleural effusion or pneumothorax. The central airways are patent. Upper Abdomen: No acute abnormality. Musculoskeletal: No chest wall abnormality. No acute or significant osseous findings. Review of the MIP images confirms the above findings. IMPRESSION: No acute intrathoracic pathology. No CT evidence of pulmonary embolism. Electronically Signed   By: Elgie Collard M.D.   On: 10/25/2017 21:27    Procedures Procedures (including critical care time)  Medications Ordered in ED Medications  ketorolac (TORADOL) 30 MG/ML injection 30 mg (30 mg Intramuscular Given 10/25/17 1718)  iopamidol (ISOVUE-370) 76 % injection (100 mLs Intravenous Contrast Given 10/25/17 1852)     Initial Impression / Assessment and Plan / ED Course  I have reviewed the triage vital signs and the nursing notes.  Pertinent labs & imaging results that were available during my care of the patient were reviewed by  me and considered in my medical decision making (see chart for details).     Patient presented with intermittent right-sided chest pain for 1 week, worsening this morning.  Afebrile, vital signs are stable.  She is nontoxic in appearance.  Chest pain is not reproducible on palpation, however is reproducible with deep inspiration and position changes, suggesting possible musculoskeletal etiology.  Serial troponins are negative, EKG is unchanged from last and shows no evidence of ST segment abnormality or arrhythmia. HEART score of 2 and I have a low suspicion of ACS or MI.  D-dimer is negative.  Chest x-ray shows no acute cardiopulmonary abnormality such as pneumonia or pleural effusion.  No evidence of pericarditis, myocarditis, or bronchitis.  CTA of the chest shows no acute cardiopulmonary abnormalities including PE or pneumonia.  No further emergent workup required at this time. Received toradol in the ED which she tolerated without difficulty.  On reevaluation, she states she is feeling much better.  She is stable for discharge home with ibuprofen and Tylenol for pain, and follow-up with primary care physician or cardiologist on an outpatient basis for reevaluation.  Discussed indications for return to the ED. Pt verbalized understanding of and agreement with plan and is safe for discharge home at this time.  Patient seen and evaluated by Dr. Donnald Garre who agrees with assessment and plan at this time..  Final Clinical Impressions(s) / ED Diagnoses   Final diagnoses:  Right-sided chest pain  Shortness of breath    ED Discharge Orders    None       Bennye Alm 10/25/17 2148    Arby Barrette, MD 11/01/17 0025

## 2017-10-25 NOTE — ED Notes (Signed)
Patient verbalizes understanding of discharge instructions. Opportunity for questioning and answers were provided. Armband removed by staff, pt discharged from ED ambulatory accompanied by family.

## 2017-10-25 NOTE — ED Notes (Signed)
Pt voicing concerns stating she does not want to leave the hospital while she has the right sided chest pain with movement. Will notify MD

## 2017-10-25 NOTE — ED Triage Notes (Signed)
Pt arrives EMS with right sided chest pain that is worse with movement. PT describes pain as "uncomfortable". Denies n/v/sob, diaphoresis, cough, recent injury.   114/76 Hr 65 NSR  spo2 99% RA rr 16

## 2017-10-25 NOTE — Discharge Instructions (Addendum)
Take 800mg  ibuprofen every 8 hours as needed for pain. Apply ice or heat for comfort.  Follow-up with your primary care physician or cardiologist for reevaluation of symptoms.  Return to the ED immediately for any concerning signs or symptoms develop such as worsening chest pain or shortness of breath, fevers, or coughing up blood.

## 2017-10-25 NOTE — ED Provider Notes (Signed)
Medical screening examination/treatment/procedure(s) were conducted as a shared visit with non-physician practitioner(s) and myself.  I personally evaluated the patient during the encounter.   EKG Interpretation  Date/Time:  Monday October 25 2017 12:47:29 EST Ventricular Rate:  59 PR Interval:  150 QRS Duration: 84 QT Interval:  418 QTC Calculation: 413 R Axis:   7 Text Interpretation:  Sinus bradycardia Otherwise normal ECG no ischemic changes. no sig change from previous. Confirmed by Arby BarrettePfeiffer, Patra Gherardi [phone]([id number]) on 10/25/2017 3:28:23 PM     Patient was reporting chest pain that she has been experiencing on the right side for approximately a week.  Is worse with deep breaths and certain movements.  She is alert and nontoxic.  Normal examination.  She does have reproducible right-sided chest pain.  After completing evaluation of the patient and her diagnostic results, considering risk factors, I felt the patient was stable for discharge.  Findings are most suggestive of musculoskeletal chest wall pain.  Patient was extremely concerned about the etiology of her pain despite my having spent considerable time reviewing her risk factors, physical examination and diagnostic findings.  Patient had perception that she needed admission to the hospital.  After completing rule out by troponins x3 and CTA of the chest to rule out any possibility of occult findings or pulmonary embolus, I feel the patient is stable to continue her diagnostic evaluation with her primary care physician for repeat cardiac stress testing if indicated.  This has been reviewed with the patient and is aware of the follow-up plan.   Arby BarrettePfeiffer, Ishan Sanroman, MD 10/29/17 [phone]

## 2017-10-25 NOTE — ED Notes (Signed)
Patient transported to CT 

## 2018-06-16 ENCOUNTER — Emergency Department (HOSPITAL_COMMUNITY)
Admission: EM | Admit: 2018-06-16 | Discharge: 2018-06-16 | Disposition: A | Discharge status: Home / Self Care | Payer: 59 | Attending: Emergency Medicine | Admitting: Emergency Medicine

## 2018-06-16 ENCOUNTER — Encounter (HOSPITAL_COMMUNITY): Payer: Self-pay | Admitting: Emergency Medicine

## 2018-06-16 ENCOUNTER — Other Ambulatory Visit: Payer: Self-pay

## 2018-06-16 DIAGNOSIS — T7621XA Adult sexual abuse, suspected, initial encounter: Secondary | ICD-10-CM | POA: Diagnosis not present

## 2018-06-16 DIAGNOSIS — Z79899 Other long term (current) drug therapy: Secondary | ICD-10-CM | POA: Insufficient documentation
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
## 2019-11-06 ENCOUNTER — Ambulatory Visit
Admission: EM | Admit: 2019-11-06 | Discharge: 2019-11-06 | Disposition: A | Discharge status: Home / Self Care | Payer: 59 | Attending: Emergency Medicine | Admitting: Emergency Medicine

## 2019-11-06 ENCOUNTER — Other Ambulatory Visit: Payer: Self-pay

## 2019-11-06 DIAGNOSIS — J209 Acute bronchitis, unspecified: Secondary | ICD-10-CM | POA: Diagnosis not present

## 2019-11-06 DIAGNOSIS — Z20822 Contact with and (suspected) exposure to covid-19: Secondary | ICD-10-CM

## 2019-11-06 MED ORDER — FLUTICASONE PROPIONATE 50 MCG/ACT NA SUSP: 1.0000 | Freq: Every day | NASAL | 0 refills | Status: AC

## 2019-11-06 MED ORDER — AZITHROMYCIN 250 MG PO TABS: 250.0000 mg | ORAL_TABLET | Freq: Every day | ORAL | 0 refills | Status: DC

## 2019-11-06 MED ORDER — BENZONATATE 100 MG PO CAPS: 100.0000 mg | ORAL_CAPSULE | Freq: Three times a day (TID) | ORAL | 0 refills | Status: DC

## 2019-11-06 NOTE — ED Provider Notes (Signed)
[facility]    CSN: [id number] Arrival date & time: 11/06/19  0907      History   Chief Complaint Chief Complaint  Patient presents with  . Cough    HPI Amy Cortez is a 54 y.o. female.   Amy Cortez 54 years old female presented to the urgent care with a complaint of cough for the past 2 weeks.  Denies sick exposure to COVID, flu or strep.  Denies recent travel.  Denies aggravating or alleviating symptoms.  Denies previous COVID infection.   Denies fever, chills, fatigue, nasal congestion, rhinorrhea, sore throat, SOB, wheezing, chest pain, nausea, vomiting, changes in bowel or bladder habits.    The history is provided by the patient. No language interpreter was used.  Cough   Past Medical History:  Diagnosis Date  . Kidney stones     There are no problems to display for this patient.   Past Surgical History:  Procedure Laterality Date  . ABDOMINAL HYSTERECTOMY      OB History   No obstetric history on file.      Home Medications    Prior to Admission medications   Medication Sig Start Date End Date Taking? Authorizing Provider  azithromycin (ZITHROMAX) 250 MG tablet Take 1 tablet (250 mg total) by mouth daily. Take first 2 tablets together, then 1 every day until finished. 11/06/19   Haidee Stogsdill, Darrelyn Hillock, FNP  benzonatate (TESSALON) 100 MG capsule Take 1 capsule (100 mg total) by mouth every 8 (eight) hours. 11/06/19   Treshon Stannard, Darrelyn Hillock, FNP  Cyanocobalamin (VITAMIN B 12 PO) Take 1,000 mg by mouth daily.    [provider]  FISH OIL-KRILL OIL PO Take 1 capsule by mouth 3 (three) times a week.    [provider]  fluticasone (FLONASE) 50 MCG/ACT nasal spray Place 1 spray into both nostrils daily for 14 days. 11/06/19 11/20/19  Angelus Hoopes, Darrelyn Hillock, FNP  TURMERIC PO Take 1 capsule by mouth daily.    [provider]  vitamin E (VITAMIN E) 400 UNIT capsule Take 400 Units by mouth 3 (three) times a week.    [provider]   [employer], Dioscorea villosa, (WILD YAM PO) Take 1 tablet by mouth 3 (three) times a week.    [provider]    Family History History reviewed. No pertinent family history.  Social History Social History   Tobacco Use  . Smoking status: Never Smoker  . Smokeless tobacco: Never Used  Substance Use Topics  . Alcohol use: No  . Drug use: No     Allergies   Celebrex [celecoxib]   Review of Systems Review of Systems  Constitutional: Negative.   HENT: Negative.   Respiratory: Positive for cough.   Cardiovascular: Negative.   Gastrointestinal: Negative.   Neurological: Negative.      Physical Exam Triage Vital Signs ED Triage Vitals [11/06/19 0940]  Enc Vitals Group     BP 119/75     Pulse Rate 85     Resp (!) 22     Temp 99.3 F (37.4 C)     Temp src      SpO2 93 %     Weight      Height      Head Circumference      Peak Flow      Pain Score      Pain Loc      Pain Edu?      Excl. in [place]?  No data found.  Updated Vital Signs BP 119/75   Pulse 85   Temp 99.3 F (37.4 C)   Resp (!) 22   SpO2 93%   Visual Acuity Right Eye Distance:   Left Eye Distance:   Bilateral Distance:    Right Eye Near:   Left Eye Near:    Bilateral Near:     Physical Exam Vitals and nursing note reviewed.  Constitutional:      General: She is not in acute distress.    Appearance: Normal appearance. She is normal weight. She is not ill-appearing or toxic-appearing.  HENT:     Head: Normocephalic.     Right Ear: Tympanic membrane, ear canal and external ear normal. There is no impacted cerumen.     Left Ear: Tympanic membrane, ear canal and external ear normal. There is no impacted cerumen.     Nose: Nose normal. No congestion.     Mouth/Throat:     Mouth: Mucous membranes are moist.     Pharynx: Oropharynx is clear. No oropharyngeal exudate or posterior oropharyngeal erythema.  Cardiovascular:     Rate and Rhythm: Normal rate and regular rhythm.      Pulses: Normal pulses.     Heart sounds: Normal heart sounds. No murmur.  Pulmonary:     Effort: Pulmonary effort is normal. No respiratory distress.     Breath sounds: Normal breath sounds. No wheezing or rhonchi.  Chest:     Chest wall: No tenderness.  Abdominal:     General: Abdomen is flat. Bowel sounds are normal. There is no distension.     Palpations: There is no mass.     Tenderness: There is no abdominal tenderness.  Skin:    Capillary Refill: Capillary refill takes less than 2 seconds.  Neurological:     General: No focal deficit present.     Mental Status: She is alert and oriented to person, place, and time.      UC Treatments / Results  Labs (all labs ordered are listed, but only abnormal results are displayed) Labs Reviewed  NOVEL CORONAVIRUS, NAA    EKG   Radiology No results found.  Procedures Procedures (including critical care time)  Medications Ordered in UC Medications - No data to display  Initial Impression / Assessment and Plan / UC Course  I have reviewed the triage vital signs and the nursing notes.  Pertinent labs & imaging results that were available during my care of the patient were reviewed by me and considered in my medical decision making (see chart for details).    COVID-19 test was ordered for rule out Advised patient to quarantine Azithromycin prescribed for bronchitis Tessalon Perles for cough Flonase for congestion Take medication as prescribed To go to ED for worsening of symptoms Patient verbalized understanding plan of care  Final Clinical Impressions(s) / UC Diagnoses   Final diagnoses:  COVID-19 ruled out  Acute bronchitis, unspecified organism     Discharge Instructions     COVID testing ordered.  It will take between 2-7 days for test results.  Someone will contact you regarding abnormal results.    In the meantime: You should remain isolated in your home for 10 days from symptom onset AND greater than 72  hours after symptoms resolution (absence of fever without the use of fever-reducing medication and improvement in respiratory symptoms), whichever is longer Get plenty of rest and push fluids Tessalon Perles prescribed for cough Flonase prescribed for nasal congestion and runny  nose Use medications daily for symptom relief Use OTC medications like ibuprofen or tylenol as needed fever or pain Call or go to the ED if you have any new or worsening symptoms such as fever, worsening cough, shortness of breath, chest tightness, chest pain, turning blue, changes in mental status, etc...     ED Prescriptions    Medication Sig Dispense Auth. Provider   benzonatate (TESSALON) 100 MG capsule Take 1 capsule (100 mg total) by mouth every 8 (eight) hours. 21 capsule Dan Dissinger S, FNP   fluticasone (FLONASE) 50 MCG/ACT nasal spray Place 1 spray into both nostrils daily for 14 days. 16 g Shantasia Hunnell, Zachery Dakins, FNP   azithromycin (ZITHROMAX) 250 MG tablet Take 1 tablet (250 mg total) by mouth daily. Take first 2 tablets together, then 1 every day until finished. 6 tablet Breccan Galant, Zachery Dakins, FNP     PDMP not reviewed this encounter.   Durward Parcel, FNP 11/06/19 1004

## 2019-11-06 NOTE — ED Triage Notes (Signed)
Pt presents with cough for past 2 weeks , with no relief

## 2019-11-06 NOTE — Discharge Instructions (Addendum)
COVID testing ordered.  It will take between 2-7 days for test results.  Someone will contact you regarding abnormal results.    In the meantime: You should remain isolated in your home for 10 days from symptom onset AND greater than 72 hours after symptoms resolution (absence of fever without the use of fever-reducing medication and improvement in respiratory symptoms), whichever is longer Get plenty of rest and push fluids Tessalon Perles prescribed for cough Flonase prescribed for nasal congestion and runny nose Use medications daily for symptom relief Use OTC medications like ibuprofen or tylenol as needed fever or pain Call or go to the ED if you have any new or worsening symptoms such as fever, worsening cough, shortness of breath, chest tightness, chest pain, turning blue, changes in mental status, etc...  

## 2019-11-07 LAB — NOVEL CORONAVIRUS, NAA: SARS-CoV-2, NAA: NOT DETECTED

## 2019-11-15 ENCOUNTER — Ambulatory Visit
Admission: EM | Admit: 2019-11-15 | Discharge: 2019-11-15 | Disposition: A | Discharge status: Home / Self Care | Payer: 59 | Attending: Emergency Medicine | Admitting: Emergency Medicine

## 2019-11-15 ENCOUNTER — Other Ambulatory Visit: Payer: Self-pay

## 2019-11-15 ENCOUNTER — Ambulatory Visit: Payer: 59

## 2019-11-15 ENCOUNTER — Ambulatory Visit (INDEPENDENT_AMBULATORY_CARE_PROVIDER_SITE_OTHER): Payer: 59

## 2019-11-15 DIAGNOSIS — R0602 Shortness of breath: Secondary | ICD-10-CM | POA: Diagnosis not present

## 2019-11-15 DIAGNOSIS — J069 Acute upper respiratory infection, unspecified: Secondary | ICD-10-CM | POA: Diagnosis not present

## 2019-11-15 DIAGNOSIS — R05 Cough: Secondary | ICD-10-CM

## 2019-11-15 DIAGNOSIS — R059 Cough, unspecified: Secondary | ICD-10-CM

## 2019-11-15 MED ORDER — BENZONATATE 100 MG PO CAPS: 100.0000 mg | ORAL_CAPSULE | Freq: Three times a day (TID) | ORAL | 0 refills | Status: DC

## 2019-11-15 MED ORDER — ALBUTEROL SULFATE HFA 108 (90 BASE) MCG/ACT IN AERS: 1.0000 | INHALATION_SPRAY | Freq: Four times a day (QID) | RESPIRATORY_TRACT | 0 refills | Status: DC | PRN

## 2019-11-15 MED ORDER — PREDNISONE 10 MG PO TABS: 20.0000 mg | ORAL_TABLET | Freq: Every day | ORAL | 0 refills | Status: DC

## 2019-11-15 NOTE — Discharge Instructions (Signed)
Advised patient to take medication as prescribed Follow-up with primary care To go to ED for worsening of symptoms

## 2019-11-15 NOTE — ED Provider Notes (Addendum)
[facility]    CSN: [id number] Arrival date & time: 11/15/19  1884      History   Chief Complaint Chief Complaint  Patient presents with  . Cough  . Shortness of Breath    HPI Amy Cortez is a 54 y.o. female.   who presents to the urgent care with a complaint of cough, shortness of breath, chest tightness, fatigue for the past 2 weeks.  Denies sick exposure to COVID, flu or strep.  Denies recent travel.  Denies aggravating or alleviating symptoms.  Denies previous COVID infection.   Denies fever, chills, fatigue, nasal congestion, rhinorrhea, sore throat,  wheezing, chest pain, nausea, vomiting, changes in bowel or bladder habits.    The history is provided by the patient. No language interpreter was used.    Past Medical History:  Diagnosis Date  . Kidney stones     There are no problems to display for this patient.   Past Surgical History:  Procedure Laterality Date  . ABDOMINAL HYSTERECTOMY      OB History   No obstetric history on file.      Home Medications    Prior to Admission medications   Medication Sig Start Date End Date Taking? Authorizing Provider  albuterol (VENTOLIN HFA) 108 (90 Base) MCG/ACT inhaler Inhale 1-2 puffs into the lungs every 6 (six) hours as needed for wheezing or shortness of breath. 11/15/19   Mahina Salatino, Darrelyn Hillock, FNP  azithromycin (ZITHROMAX) 250 MG tablet Take 1 tablet (250 mg total) by mouth daily. Take first 2 tablets together, then 1 every day until finished. 11/06/19   Finneus Kaneshiro, Darrelyn Hillock, FNP  benzonatate (TESSALON) 100 MG capsule Take 1 capsule (100 mg total) by mouth every 8 (eight) hours. 11/15/19   Nitza Schmid, Darrelyn Hillock, FNP  Cyanocobalamin (VITAMIN B 12 PO) Take 1,000 mg by mouth daily.    [provider]  FISH OIL-KRILL OIL PO Take 1 capsule by mouth 3 (three) times a week.    [provider]  fluticasone (FLONASE) 50 MCG/ACT nasal spray Place 1 spray into both nostrils daily for 14 days. 11/06/19  11/20/19  Gaynelle Pastrana, Darrelyn Hillock, FNP  predniSONE (DELTASONE) 10 MG tablet Take 2 tablets (20 mg total) by mouth daily. 11/15/19   Cherrie Franca, Darrelyn Hillock, FNP  TURMERIC PO Take 1 capsule by mouth daily.    [provider]  vitamin E (VITAMIN E) 400 UNIT capsule Take 400 Units by mouth 3 (three) times a week.    [provider]  [employer], Dioscorea villosa, (WILD YAM PO) Take 1 tablet by mouth 3 (three) times a week.    [provider]    Family History History reviewed. No pertinent family history.  Social History Social History   Tobacco Use  . Smoking status: Never Smoker  . Smokeless tobacco: Never Used  Substance Use Topics  . Alcohol use: No  . Drug use: No     Allergies   Celebrex [celecoxib]   Review of Systems Review of Systems  Constitutional: Negative.   HENT: Negative.   Respiratory: Positive for cough and shortness of breath.   Cardiovascular: Negative.   Gastrointestinal: Negative.   Neurological: Negative.      Physical Exam Triage Vital Signs ED Triage Vitals [11/15/19 0940]  Enc Vitals Group     BP 118/78     Pulse Rate 66     Resp 20     Temp 98.7 F (37.1 C)     Temp  src      SpO2 95 %     Weight      Height      Head Circumference      Peak Flow      Pain Score      Pain Loc      Pain Edu?      Excl. in GC?    No data found.  Updated Vital Signs BP 118/78   Pulse 66   Temp 98.7 F (37.1 C)   Resp 20   SpO2 95%   Visual Acuity Right Eye Distance:   Left Eye Distance:   Bilateral Distance:    Right Eye Near:   Left Eye Near:    Bilateral Near:     Physical Exam Vitals and nursing note reviewed.  Constitutional:      General: She is not in acute distress.    Appearance: Normal appearance. She is normal weight. She is not ill-appearing or toxic-appearing.  HENT:     Head: Normocephalic.     Right Ear: Tympanic membrane, ear canal and external ear normal. There is no impacted cerumen.     Left Ear:  Tympanic membrane, ear canal and external ear normal. There is no impacted cerumen.     Nose: Nose normal. No congestion.     Mouth/Throat:     Mouth: Mucous membranes are moist.     Pharynx: Oropharynx is clear. No oropharyngeal exudate or posterior oropharyngeal erythema.  Cardiovascular:     Rate and Rhythm: Normal rate and regular rhythm.     Pulses: Normal pulses.     Heart sounds: Normal heart sounds. No murmur.  Pulmonary:     Effort: Pulmonary effort is normal. No respiratory distress.     Breath sounds: Normal breath sounds. No wheezing or rhonchi.  Chest:     Chest wall: No tenderness.  Abdominal:     General: Abdomen is flat. Bowel sounds are normal. There is no distension.     Palpations: There is no mass.     Tenderness: There is no abdominal tenderness.  Skin:    Capillary Refill: Capillary refill takes less than 2 seconds.  Neurological:     General: No focal deficit present.     Mental Status: She is alert and oriented to person, place, and time.      UC Treatments / Results  Labs (all labs ordered are listed, but only abnormal results are displayed) Labs Reviewed - No data to display  EKG   Radiology DG Chest 2 View  Result Date: 11/15/2019 CLINICAL DATA:  Cough, shortness of breath. EXAM: CHEST - 2 VIEW COMPARISON:  October 25, 2017. FINDINGS: The heart size and mediastinal contours are within normal limits. Both lungs are clear. No pneumothorax or pleural effusion is noted. The visualized skeletal structures are unremarkable. IMPRESSION: No active cardiopulmonary disease. Electronically Signed   By: Lupita Raider M.D.   On: 11/15/2019 09:56    Procedures Procedures (including critical care time)  Medications Ordered in UC Medications - No data to display  Initial Impression / Assessment and Plan / UC Course  I have reviewed the triage vital signs and the nursing notes.  Pertinent labs & imaging results that were available during my care of the  patient were reviewed by me and considered in my medical decision making (see chart for details).   ED EKG was completed.  Results showed sinus bradycardia Chest x-ray was completed and result was negative for cardiopulmonary  disease. ProAir prescribed for shortness of breath Short-term prednisone will be prescribed Tessalon Perles prescribed for cough ER follow-up precaution was given  Final Clinical Impressions(s) / UC Diagnoses   Final diagnoses:  Acute upper respiratory infection  SOB (shortness of breath)  Cough     Discharge Instructions     Advised patient to take medication as prescribed Follow-up with primary care To go to ED for worsening of symptoms    ED Prescriptions    Medication Sig Dispense Auth. Provider   albuterol (VENTOLIN HFA) 108 (90 Base) MCG/ACT inhaler Inhale 1-2 puffs into the lungs every 6 (six) hours as needed for wheezing or shortness of breath. 18 g Siyana Erney, Zachery Dakins, FNP   benzonatate (TESSALON) 100 MG capsule Take 1 capsule (100 mg total) by mouth every 8 (eight) hours. 21 capsule Lumina Gitto S, FNP   predniSONE (DELTASONE) 10 MG tablet Take 2 tablets (20 mg total) by mouth daily. 15 tablet Jarelle Ates, Zachery Dakins, FNP     PDMP not reviewed this encounter.   Durward Parcel, FNP 11/15/19 1047    Durward Parcel, FNP 11/15/19 1048

## 2019-11-15 NOTE — ED Triage Notes (Signed)
Pt states that cough is getting worse and is having increasing fatigue

## 2019-12-28 ENCOUNTER — Ambulatory Visit (INDEPENDENT_AMBULATORY_CARE_PROVIDER_SITE_OTHER): Payer: 59

## 2019-12-28 ENCOUNTER — Other Ambulatory Visit: Payer: Self-pay

## 2019-12-28 ENCOUNTER — Ambulatory Visit
Admission: EM | Admit: 2019-12-28 | Discharge: 2019-12-28 | Disposition: A | Discharge status: Home / Self Care | Payer: 59

## 2019-12-28 DIAGNOSIS — M79675 Pain in left toe(s): Secondary | ICD-10-CM | POA: Diagnosis not present

## 2019-12-28 DIAGNOSIS — M25551 Pain in right hip: Secondary | ICD-10-CM | POA: Diagnosis not present

## 2019-12-28 DIAGNOSIS — M25562 Pain in left knee: Secondary | ICD-10-CM | POA: Diagnosis not present

## 2019-12-28 MED ORDER — ACETAMINOPHEN ER 650 MG PO TBCR: 650.0000 mg | EXTENDED_RELEASE_TABLET | Freq: Three times a day (TID) | ORAL | 0 refills | Status: AC | PRN

## 2019-12-28 NOTE — ED Triage Notes (Signed)
Pt reports slipped on wet floor walking into [employer] today. C/O pain to r hip, left knee, and left great toe.

## 2019-12-28 NOTE — ED Provider Notes (Signed)
[facility]    CSN: [id number] Arrival date & time: 12/28/19  1720      History   Chief Complaint No chief complaint on file.   HPI Amy Cortez is a 54 y.o. female.   Presented to the urgent care for complaint of right hip, left knee and left great toe pain that started today.  Reports she slipped on wet floor while walking into [employer].  She localized the pain to the right hip, left knee, and left great toe.She describes the pain as constant and achy and rated at 5 on a scale of 1-10.  Pain is worse on range of motion.  Has tried OTC medication with no relief.  Her symptoms are made worse with movement.  She denies similar symptoms in the past.      Past Medical History:  Diagnosis Date  . Kidney stones     There are no problems to display for this patient.   Past Surgical History:  Procedure Laterality Date  . ABDOMINAL HYSTERECTOMY      OB History   No obstetric history on file.      Home Medications    Prior to Admission medications   Medication Sig Start Date End Date Taking? Authorizing Provider  Cyanocobalamin (VITAMIN B 12 PO) Take 1,000 mg by mouth daily.   Yes [provider]  TURMERIC PO Take 1 capsule by mouth daily.   Yes [provider]  vitamin C (ASCORBIC ACID) 250 MG tablet Take 500 mg by mouth daily.   Yes [provider]  vitamin E (VITAMIN E) 400 UNIT capsule Take 400 Units by mouth 3 (three) times a week.   Yes [provider]  [employer], Dioscorea villosa, (WILD YAM PO) Take 1 tablet by mouth 3 (three) times a week.   Yes [provider]  acetaminophen (TYLENOL 8 HOUR) 650 MG CR tablet Take 1 tablet (650 mg total) by mouth every 8 (eight) hours as needed for pain. 12/28/19   Velma Hanna, Zachery Dakins, FNP  albuterol (VENTOLIN HFA) 108 (90 Base) MCG/ACT inhaler Inhale 1-2 puffs into the lungs every 6 (six) hours as needed for wheezing or shortness of breath. 11/15/19   Sy Saintjean, Zachery Dakins, FNP    azithromycin (ZITHROMAX) 250 MG tablet Take 1 tablet (250 mg total) by mouth daily. Take first 2 tablets together, then 1 every day until finished. 11/06/19   Anaira Seay, Zachery Dakins, FNP  benzonatate (TESSALON) 100 MG capsule Take 1 capsule (100 mg total) by mouth every 8 (eight) hours. 11/15/19   Jaishaun Mcnab, Zachery Dakins, FNP  FISH OIL-KRILL OIL PO Take 1 capsule by mouth 3 (three) times a week.    [provider]  fluticasone (FLONASE) 50 MCG/ACT nasal spray Place 1 spray into both nostrils daily for 14 days. 11/06/19 11/20/19  Dayle Sherpa, Zachery Dakins, FNP  predniSONE (DELTASONE) 10 MG tablet Take 2 tablets (20 mg total) by mouth daily. 11/15/19   Terald Jump, Zachery Dakins, FNP    Family History No family history on file.  Social History Social History   Tobacco Use  . Smoking status: Never Smoker  . Smokeless tobacco: Never Used  Substance Use Topics  . Alcohol use: No  . Drug use: No     Allergies   Celebrex [celecoxib]   Review of Systems Review of Systems  Constitutional: Negative.   Respiratory: Negative.   Cardiovascular: Negative.   Musculoskeletal:       Left knee pain, left great toe pain  Right hip pain  All other systems reviewed and are negative.    Physical Exam Triage Vital Signs ED Triage Vitals  Enc Vitals Group     BP      Pulse      Resp      Temp      Temp src      SpO2      Weight      Height      Head Circumference      Peak Flow      Pain Score      Pain Loc      Pain Edu?      Excl. in GC?    No data found.  Updated Vital Signs BP 116/64 (BP Location: Right Arm)   Pulse 68   Temp 98 F (36.7 C) (Oral)   Resp 18   Ht 5\' 6"  (1.676 m)   Wt 241 lb (109.3 kg)   SpO2 98%   BMI 38.90 kg/m   Visual Acuity Right Eye Distance:   Left Eye Distance:   Bilateral Distance:    Right Eye Near:   Left Eye Near:    Bilateral Near:     Physical Exam Vitals and nursing note reviewed.  Constitutional:      General: She is not in acute distress.     Appearance: Normal appearance. She is normal weight. She is not ill-appearing, toxic-appearing or diaphoretic.  Cardiovascular:     Rate and Rhythm: Normal rate and regular rhythm.     Pulses: Normal pulses.     Heart sounds: Normal heart sounds. No murmur. No friction rub. No gallop.   Pulmonary:     Effort: Pulmonary effort is normal. No respiratory distress.     Breath sounds: Normal breath sounds. No stridor. No wheezing, rhonchi or rales.  Chest:     Chest wall: No tenderness.  Musculoskeletal:        General: Tenderness present.     Right hip: Tenderness present.     Left hip: Normal.     Right knee: Normal.     Left knee: Swelling present. Tenderness present.     Right foot: Normal.     Left foot: Tenderness present.     Comments: Patient is able to bear weight and ambulate with mild pain on her left knee.  No surface trauma or erythema or warmth.  No ecchymosis.  The right knee is without any obvious asymmetry or deformity when compared to the left knee.  Able to bend knee.  Negative McMurray test  Neurological:     General: No focal deficit present.     Mental Status: She is alert and oriented to person, place, and time.     Cranial Nerves: No cranial nerve deficit.     Sensory: No sensory deficit.     Motor: No weakness.     Coordination: Coordination normal.      UC Treatments / Results  Labs (all labs ordered are listed, but only abnormal results are displayed) Labs Reviewed - No data to display  EKG   Radiology DG Knee Complete 4 Views Left  Result Date: 12/28/2019 CLINICAL DATA:  Knee pain. EXAM: LEFT KNEE - COMPLETE 4+ VIEW COMPARISON:  11/27/2016 FINDINGS: There is no acute displaced fracture or dislocation. 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 There is no significant joint effusion. No significant prepatellar soft tissue swelling. IMPRESSION: 1. No acute displaced fracture or dislocation. 2. 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  Electronically Signed   By: Constance Holster M.D.   On: 12/28/2019 18:15    Procedures Procedures (including critical care time)  Medications Ordered in UC Medications - No data to display  Initial Impression / Assessment and Plan / UC Course  I have reviewed the triage vital signs and the nursing notes.  Pertinent labs & imaging results that were available during my care of the patient were reviewed by me and considered in my medical decision making (see chart for details).     X-ray is negative for bony abnormality including fracture or dislocation.  I have reviewed the x-ray myself and the radiologist interpretation.  I am in agreement with the radiologist interpretation.  Patient is stable for discharge. Ibuprofen was prescribed for pain Follow-up with primary care  Final Clinical Impressions(s) / UC Diagnoses   Final diagnoses:  Right hip pain  Acute pain of left knee  Pain of left great toe     Discharge Instructions     Rest, ice and heat as needed Ensure adequate ROM as tolerated. Prescribed ibuprofen as needed for  pain relief Return here or go to ER if you have any new or worsening symptoms     ED Prescriptions    Medication Sig Dispense Auth. Provider   acetaminophen (TYLENOL 8 HOUR) 650 MG CR tablet Take 1 tablet (650 mg total) by mouth every 8 (eight) hours as needed for pain. 30 tablet Delora Gravatt, Darrelyn Hillock, FNP     PDMP not reviewed this encounter.   Emerson Monte, [place] 12/28/19 1821

## 2019-12-28 NOTE — Discharge Instructions (Addendum)
Rest, ice and heat as needed Ensure adequate ROM as tolerated. Prescribed ibuprofen as needed for  pain relief Return here or go to ER if you have any new or worsening symptoms

## 2020-06-01 ENCOUNTER — Ambulatory Visit
Admission: EM | Admit: 2020-06-01 | Discharge: 2020-06-01 | Disposition: A | Discharge status: Home / Self Care | Payer: 59 | Attending: Family Medicine | Admitting: Family Medicine

## 2020-06-01 DIAGNOSIS — B349 Viral infection, unspecified: Secondary | ICD-10-CM

## 2020-06-01 DIAGNOSIS — Z1152 Encounter for screening for COVID-19: Secondary | ICD-10-CM

## 2020-06-01 MED ORDER — BENZONATATE 100 MG PO CAPS: 100.0000 mg | ORAL_CAPSULE | Freq: Three times a day (TID) | ORAL | 0 refills | Status: AC

## 2020-06-01 MED ORDER — CETIRIZINE HCL 10 MG PO TABS: 10.0000 mg | ORAL_TABLET | Freq: Every day | ORAL | 0 refills | Status: AC

## 2020-06-01 NOTE — Discharge Instructions (Addendum)
This is most likely some sort of viral illness Tessalon Perles for cough as needed.  Zyrtec to help with nasal congestion, runny nose and postnasal drip. Rest, stay hydrated Follow up as needed for continued or worsening symptoms

## 2020-06-01 NOTE — ED Provider Notes (Signed)
[facility]    CSN: [id number] Arrival date & time: 06/01/20  0805      History   Chief Complaint Chief Complaint  Patient presents with  . Cough    HPI Amy Cortez is a 54 y.o. female.   Patient is a 54 year old female presents today with proximately 3 to 4 days of cough, nasal congestion, rhinorrhea.  Symptoms been constant.  She has had mild sore throat.  Cough is productive with clear mucus.  Denies any chest pain or shortness of breath.  Has not been COVID vaccinated.  No fever, chills, body aches, night sweats.     Past Medical History:  Diagnosis Date  . Kidney stones     There are no problems to display for this patient.   Past Surgical History:  Procedure Laterality Date  . ABDOMINAL HYSTERECTOMY      OB History   No obstetric history on file.      Home Medications    Prior to Admission medications   Medication Sig Start Date End Date Taking? Authorizing Provider  acetaminophen (TYLENOL 8 HOUR) 650 MG CR tablet Take 1 tablet (650 mg total) by mouth every 8 (eight) hours as needed for pain. 12/28/19   Avegno, Zachery Dakins, FNP  albuterol (VENTOLIN HFA) 108 (90 Base) MCG/ACT inhaler Inhale 1-2 puffs into the lungs every 6 (six) hours as needed for wheezing or shortness of breath. 11/15/19   Avegno, Zachery Dakins, FNP  benzonatate (TESSALON) 100 MG capsule Take 1 capsule (100 mg total) by mouth every 8 (eight) hours. 06/01/20   Dahlia Byes A, NP  cetirizine (ZYRTEC) 10 MG tablet Take 1 tablet (10 mg total) by mouth daily. 06/01/20   Dahlia Byes A, NP  Cyanocobalamin (VITAMIN B 12 PO) Take 1,000 mg by mouth daily.    [provider]  FISH OIL-KRILL OIL PO Take 1 capsule by mouth 3 (three) times a week.    [provider]  fluticasone (FLONASE) 50 MCG/ACT nasal spray Place 1 spray into both nostrils daily for 14 days. 11/06/19 11/20/19  Avegno, Zachery Dakins, FNP  predniSONE (DELTASONE) 10 MG tablet Take 2 tablets (20 mg total) by mouth daily.  11/15/19   Avegno, Zachery Dakins, FNP  TURMERIC PO Take 1 capsule by mouth daily.    [provider]  vitamin C (ASCORBIC ACID) 250 MG tablet Take 500 mg by mouth daily.    [provider]  vitamin E (VITAMIN E) 400 UNIT capsule Take 400 Units by mouth 3 (three) times a week.    [provider]  [employer], Dioscorea villosa, (WILD YAM PO) Take 1 tablet by mouth 3 (three) times a week.    [provider]    Family History History reviewed. No pertinent family history.  Social History Social History   Tobacco Use  . Smoking status: Never Smoker  . Smokeless tobacco: Never Used  Vaping Use  . Vaping Use: Never used  Substance Use Topics  . Alcohol use: No  . Drug use: No     Allergies   Celebrex [celecoxib]   Review of Systems Review of Systems   Physical Exam Triage Vital Signs ED Triage Vitals  Enc Vitals Group     BP 06/01/20 0813 129/83     Pulse Rate 06/01/20 0813 89     Resp 06/01/20 0813 20     Temp 06/01/20 0813 98 F (36.7 C)     Temp src --  SpO2 06/01/20 0813 95 %     Weight --      Height --      Head Circumference --      Peak Flow --      Pain Score 06/01/20 0810 6     Pain Loc --      Pain Edu? --      Excl. in GC? --    No data found.  Updated Vital Signs BP 129/83   Pulse 89   Temp 98 F (36.7 C)   Resp 20   SpO2 95%   Visual Acuity Right Eye Distance:   Left Eye Distance:   Bilateral Distance:    Right Eye Near:   Left Eye Near:    Bilateral Near:     Physical Exam Vitals and nursing note reviewed.  Constitutional:      General: She is not in acute distress.    Appearance: Normal appearance. She is not ill-appearing, toxic-appearing or diaphoretic.  HENT:     Head: Normocephalic.     Nose: Congestion and rhinorrhea present.  Eyes:     Conjunctiva/sclera: Conjunctivae normal.  Cardiovascular:     Rate and Rhythm: Normal rate and regular rhythm.  Pulmonary:     Effort: Pulmonary effort  is normal.     Breath sounds: Normal breath sounds.  Abdominal:     Tenderness: There is no abdominal tenderness.  Musculoskeletal:        General: Normal range of motion.     Cervical back: Normal range of motion.  Skin:    General: Skin is warm and dry.     Findings: No rash.  Neurological:     Mental Status: She is alert.  Psychiatric:        Mood and Affect: Mood normal.      UC Treatments / Results  Labs (all labs ordered are listed, but only abnormal results are displayed) Labs Reviewed  NOVEL CORONAVIRUS, NAA    EKG   Radiology No results found.  Procedures Procedures (including critical care time)  Medications Ordered in UC Medications - No data to display  Initial Impression / Assessment and Plan / UC Course  I have reviewed the triage vital signs and the nursing notes.  Pertinent labs & imaging results that were available during my care of the patient were reviewed by me and considered in my medical decision making (see chart for details).     Viral illness Symptomatic treatment with Tessalon Perles for cough and Zyrtec for nasal congestion, runny nose and postnasal drip. Rest stay hydrated and Covid swab is pending. Follow up as needed for continued or worsening symptoms  Final Clinical Impressions(s) / UC Diagnoses   Final diagnoses:  Viral illness     Discharge Instructions     This is most likely some sort of viral illness Tessalon Perles for cough as needed.  Zyrtec to help with nasal congestion, runny nose and postnasal drip. Rest, stay hydrated Follow up as needed for continued or worsening symptoms      ED Prescriptions    Medication Sig Dispense Auth. Provider   benzonatate (TESSALON) 100 MG capsule Take 1 capsule (100 mg total) by mouth every 8 (eight) hours. 21 capsule Poet Hineman A, NP   cetirizine (ZYRTEC) 10 MG tablet Take 1 tablet (10 mg total) by mouth daily. 30 tablet Dahlia Byes A, NP     PDMP not reviewed this  encounter.   Janace Aris, NP 06/01/20 [phone]

## 2020-06-01 NOTE — ED Triage Notes (Signed)
Pt presents with c/o cough and nasal congestion that began on wednesday.  Pt also c/o sore throat

## 2020-06-02 LAB — SARS-COV-2, NAA 2 DAY TAT

## 2020-06-02 LAB — NOVEL CORONAVIRUS, NAA: SARS-CoV-2, NAA: NOT DETECTED

## 2020-08-08 ENCOUNTER — Other Ambulatory Visit: Payer: Self-pay

## 2020-08-08 ENCOUNTER — Ambulatory Visit
Admission: EM | Admit: 2020-08-08 | Discharge: 2020-08-08 | Disposition: A | Discharge status: Home / Self Care | Payer: 59 | Attending: Emergency Medicine | Admitting: Emergency Medicine

## 2020-08-08 DIAGNOSIS — Z1152 Encounter for screening for COVID-19: Secondary | ICD-10-CM

## 2020-08-08 DIAGNOSIS — R0602 Shortness of breath: Secondary | ICD-10-CM

## 2020-08-08 DIAGNOSIS — M545 Low back pain, unspecified: Secondary | ICD-10-CM | POA: Diagnosis not present

## 2020-08-08 DIAGNOSIS — J029 Acute pharyngitis, unspecified: Secondary | ICD-10-CM

## 2020-08-08 MED ORDER — PREDNISONE 10 MG PO TABS: 20.0000 mg | ORAL_TABLET | Freq: Every day | ORAL | 0 refills | Status: AC

## 2020-08-08 MED ORDER — ALBUTEROL SULFATE HFA 108 (90 BASE) MCG/ACT IN AERS: 1.0000 | INHALATION_SPRAY | Freq: Four times a day (QID) | RESPIRATORY_TRACT | 0 refills | Status: AC | PRN

## 2020-08-08 MED ORDER — LIDOCAINE VISCOUS HCL 2 % MT SOLN: 15.0000 mL | OROMUCOSAL | 1 refills | Status: AC | PRN

## 2020-08-08 NOTE — ED Provider Notes (Addendum)
[facility]   [id number] 08/08/20 Arrival Time: 6222   CC: COVID symptoms  SUBJECTIVE: History from: patient.  Amy Cortez is a 54 y.o. female who who presented to the urgent care for complaint of right-sided back pain, sore throat and SOB for the past 1 week.  Reports she was at a shooting range and no one was wearing a mask..  Denies recent travel.  Has tried OTC medication without relief.  Denies aggravating factors.  Denies previous symptoms in the past.   Denies fever, chills, fatigue, sinus pain, rhinorrhea,  wheezing, chest pain, nausea, changes in bowel or bladder habits.    ROS: As per HPI.  All other pertinent ROS negative.      Past Medical History:  Diagnosis Date  . Kidney stones    Past Surgical History:  Procedure Laterality Date  . ABDOMINAL HYSTERECTOMY     Allergies  Allergen Reactions  . Celebrex [Celecoxib] Rash   No current facility-administered medications on file prior to encounter.   Current Outpatient Medications on File Prior to Encounter  Medication Sig Dispense Refill  . acetaminophen (TYLENOL 8 HOUR) 650 MG CR tablet Take 1 tablet (650 mg total) by mouth every 8 (eight) hours as needed for pain. 30 tablet 0  . benzonatate (TESSALON) 100 MG capsule Take 1 capsule (100 mg total) by mouth every 8 (eight) hours. 21 capsule 0  . cetirizine (ZYRTEC) 10 MG tablet Take 1 tablet (10 mg total) by mouth daily. 30 tablet 0  . Cyanocobalamin (VITAMIN B 12 PO) Take 1,000 mg by mouth daily.    Marland Kitchen FISH OIL-KRILL OIL PO Take 1 capsule by mouth 3 (three) times a week.    . fluticasone (FLONASE) 50 MCG/ACT nasal spray Place 1 spray into both nostrils daily for 14 days. 16 g 0  . TURMERIC PO Take 1 capsule by mouth daily.    . vitamin C (ASCORBIC ACID) 250 MG tablet Take 500 mg by mouth daily.    . vitamin E (VITAMIN E) 400 UNIT capsule Take 400 Units by mouth 3 (three) times a week.    . Wild Yam, Dioscorea villosa, (WILD YAM PO) Take 1 tablet by mouth  3 (three) times a week.     Social History   Socioeconomic History  . Marital status: Divorced    Spouse name: Not on file  . Number of children: Not on file  . Years of education: Not on file  . Highest education level: Not on file  Occupational History  . Not on file  Tobacco Use  . Smoking status: Never Smoker  . Smokeless tobacco: Never Used  Vaping Use  . Vaping Use: Never used  Substance and Sexual Activity  . Alcohol use: No  . Drug use: No  . Sexual activity: Not on file  Other Topics Concern  . Not on file  Social History Narrative  . Not on file   Social Determinants of Health   Financial Resource Strain:   . Difficulty of Paying Living Expenses: Not on file  Food Insecurity:   . Worried About Programme researcher, broadcasting/film/video in the Last Year: Not on file  . Ran Out of Food in the Last Year: Not on file  Transportation Needs:   . Lack of Transportation (Medical): Not on file  . Lack of Transportation (Non-Medical): Not on file  Physical Activity:   . Days of Exercise per Week: Not on file  . Minutes of Exercise per Session: Not  on file  Stress:   . Feeling of Stress : Not on file  Social Connections:   . Frequency of Communication with Friends and Family: Not on file  . Frequency of Social Gatherings with Friends and Family: Not on file  . Attends Religious Services: Not on file  . Active Member of Clubs or Organizations: Not on file  . Attends Banker Meetings: Not on file  . Marital Status: Not on file  Intimate Partner Violence:   . Fear of Current or Ex-Partner: Not on file  . Emotionally Abused: Not on file  . Physically Abused: Not on file  . Sexually Abused: Not on file   History reviewed. No pertinent family history.  OBJECTIVE:  Vitals:   08/08/20 0848  BP: 137/83  Pulse: 74  Resp: 16  Temp: 98.4 F (36.9 C)  TempSrc: Oral  SpO2: 98%     Physical Exam Vitals and nursing note reviewed.  Constitutional:      General: She is not  in acute distress.    Appearance: Normal appearance. She is normal weight. She is not ill-appearing, toxic-appearing or diaphoretic.  HENT:     Head: Normocephalic.     Right Ear: Tympanic membrane, ear canal and external ear normal. There is no impacted cerumen.     Left Ear: Tympanic membrane, ear canal and external ear normal.     Nose: No congestion.     Mouth/Throat:     Mouth: Mucous membranes are moist.     Tonsils: No tonsillar exudate or tonsillar abscesses. 1+ on the right. 1+ on the left.  Cardiovascular:     Rate and Rhythm: Normal rate and regular rhythm.     Pulses: Normal pulses.     Heart sounds: Normal heart sounds. No murmur heard.  No friction rub. No gallop.   Pulmonary:     Effort: Pulmonary effort is normal. No respiratory distress.     Breath sounds: Normal breath sounds. No stridor. No wheezing, rhonchi or rales.  Chest:     Chest wall: No tenderness.  Musculoskeletal:        General: Tenderness present.     Comments: Back:  Patient ambulates from chair to exam table without difficulty.  Inspection: Skin clear and intact without obvious swelling, erythema, or ecchymosis. Warm to the touch  Palpation: Vertebral processes nontender. Tenderness about the lower right  paravertebral muscles  ROM: FROM Strength: 5/5 hip flexion, 5/5 knee extension, 5/5 knee flexion, 5/5 plantar flexion, 5/5 dorsiflexion    Neurological:     General: No focal deficit present.     Mental Status: She is alert and oriented to person, place, and time.     Cranial Nerves: Cranial nerves are intact.     Sensory: Sensation is intact.     Motor: Motor function is intact.     Coordination: Coordination is intact.     Gait: Gait is intact.     LABS:  No results found for this or any previous visit (from the past 24 hour(s)).   ASSESSMENT & PLAN:  1. Sore throat   2. Acute right-sided low back pain without sciatica   3. Shortness of breath   4. Encounter for screening for  COVID-19     Meds ordered this encounter  Medications  . predniSONE (DELTASONE) 10 MG tablet    Sig: Take 2 tablets (20 mg total) by mouth daily.    Dispense:  15 tablet    Refill:  0  .  lidocaine (XYLOCAINE) 2 % solution    Sig: Use as directed 15 mLs in the mouth or throat as needed for mouth pain.    Dispense:  100 mL    Refill:  1  . albuterol (VENTOLIN HFA) 108 (90 Base) MCG/ACT inhaler    Sig: Inhale 1-2 puffs into the lungs every 6 (six) hours as needed for wheezing or shortness of breath.    Dispense:  18 g    Refill:  0    Discharge instructions  COVID testing ordered.  It will take between 2-7 days for test results.  Someone will contact you regarding abnormal results.    In the meantime: You should remain isolated in your home for 10 days from symptom onset AND greater than 24 hours after symptoms resolution (absence of fever without the use of fever-reducing medication and improvement in respiratory symptoms), whichever is longer Prednisone prescribed/take as directed ProAir was prescribed Lidocaine mouthwash was prescribed for sore throat.  Make sure your food is cold  Get plenty of rest and push fluids Use medications daily for symptom relief Use OTC medications like ibuprofen or tylenol as needed fever or pain Call or go to the ED if you have any new or worsening symptoms such as fever, worsening cough, shortness of breath, chest tightness, chest pain, turning blue, changes in mental status, etc...   Reviewed expectations re: course of current medical issues. Questions answered. Outlined signs and symptoms indicating need for more acute intervention. Patient verbalized understanding. After Visit Summary given.         Durward Parcel, FNP 08/08/20 0942    Durward Parcel, FNP 08/08/20 0946    Durward Parcel, FNP 08/08/20 [phone]

## 2020-08-08 NOTE — Discharge Instructions (Addendum)
COVID testing ordered.  It will take between 2-7 days for test results.  Someone will contact you regarding abnormal results.    In the meantime: You should remain isolated in your home for 10 days from symptom onset AND greater than 24 hours after symptoms resolution (absence of fever without the use of fever-reducing medication and improvement in respiratory symptoms), whichever is longer Prednisone prescribed/take as directed ProAir was prescribed Lidocaine mouthwash was prescribed for sore throat.  Make sure your food is cold  Get plenty of rest and push fluids Use medications daily for symptom relief Use OTC medications like ibuprofen or tylenol as needed fever or pain Call or go to the ED if you have any new or worsening symptoms such as fever, worsening cough, shortness of breath, chest tightness, chest pain, turning blue, changes in mental status, etc..Marland Kitchen

## 2020-08-08 NOTE — ED Triage Notes (Signed)
Pt presents with right side muscle pain and sore throat for past week

## 2020-08-09 LAB — SARS-COV-2, NAA 2 DAY TAT

## 2020-08-09 LAB — NOVEL CORONAVIRUS, NAA: SARS-CoV-2, NAA: NOT DETECTED

## 2020-10-16 ENCOUNTER — Other Ambulatory Visit: Payer: Self-pay | Admitting: Obstetrics and Gynecology

## 2020-10-16 DIAGNOSIS — Z1231 Encounter for screening mammogram for malignant neoplasm of breast: Secondary | ICD-10-CM

## 2020-10-24 ENCOUNTER — Ambulatory Visit
Admission: RE | Admit: 2020-10-24 | Discharge: 2020-10-24 | Disposition: A | Discharge status: Home / Self Care | Payer: 59 | Source: Ambulatory Visit | Attending: Obstetrics and Gynecology | Admitting: Obstetrics and Gynecology

## 2020-10-24 ENCOUNTER — Other Ambulatory Visit: Payer: Self-pay

## 2020-10-24 DIAGNOSIS — Z1231 Encounter for screening mammogram for malignant neoplasm of breast: Secondary | ICD-10-CM

## 2020-10-29 IMAGING — DX DG KNEE COMPLETE 4+V*L*
4 series · 4 of 4 positions shown · non-contrast
Comparison: 11/27/2016

CLINICAL DATA: Knee pain.

EXAM:
LEFT KNEE - COMPLETE 4+ VIEW

[knee ap]
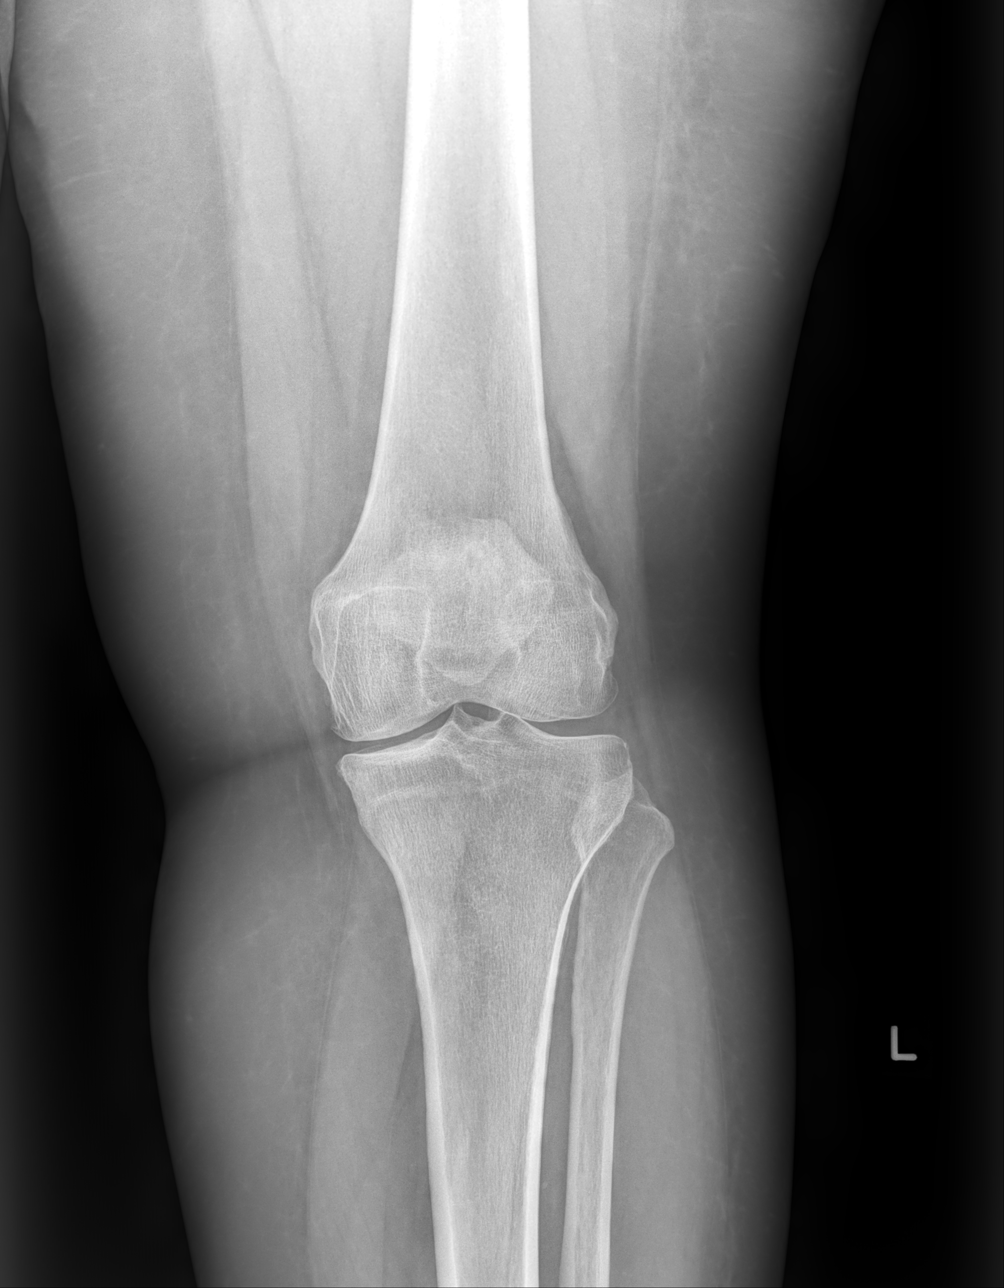

[knee mlo (1 of 2)]
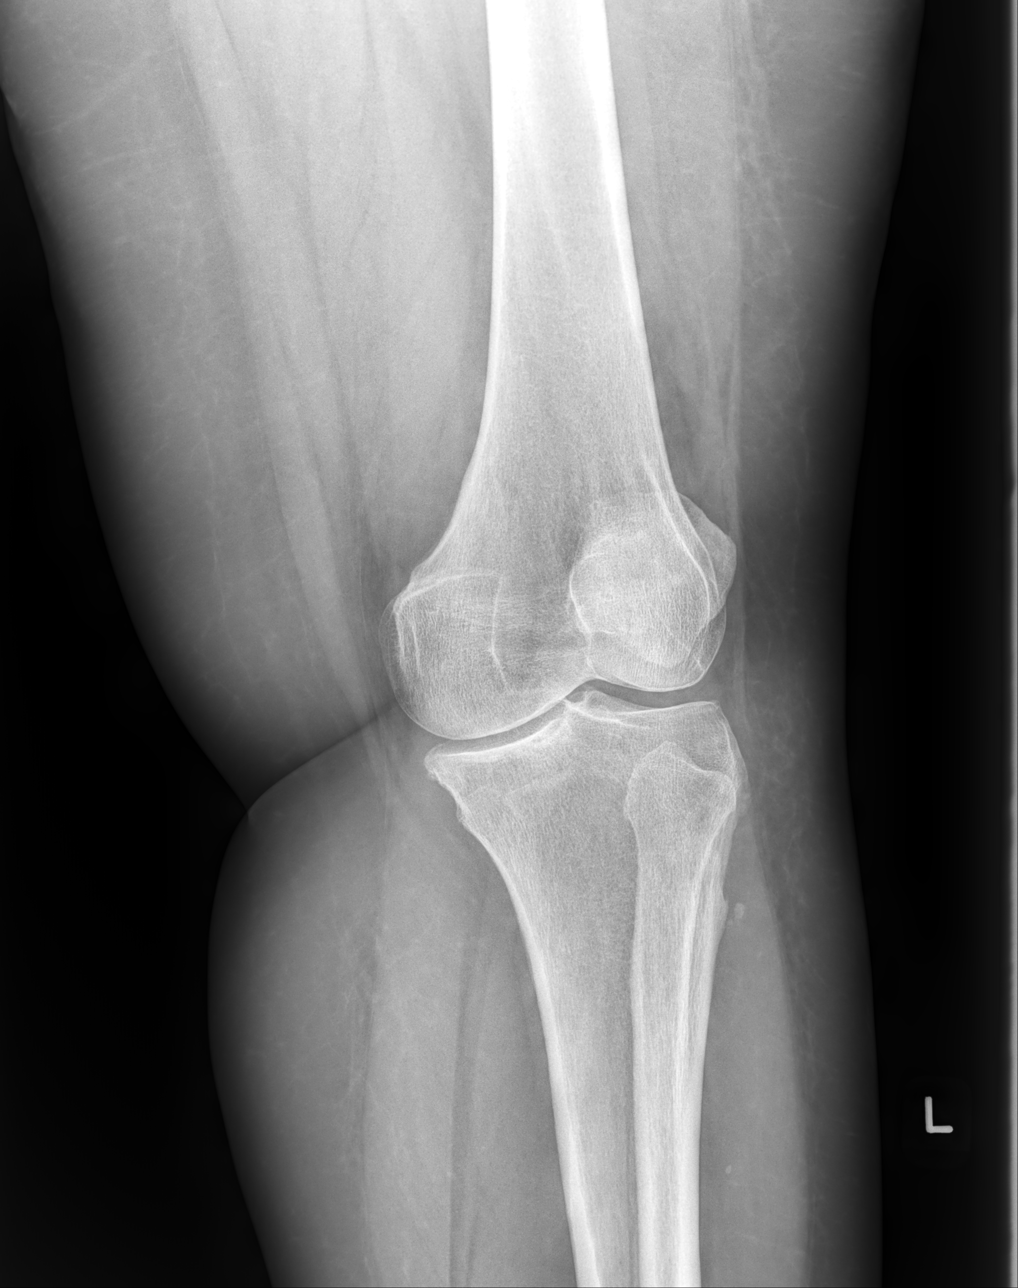

[knee mlo (2 of 2)]
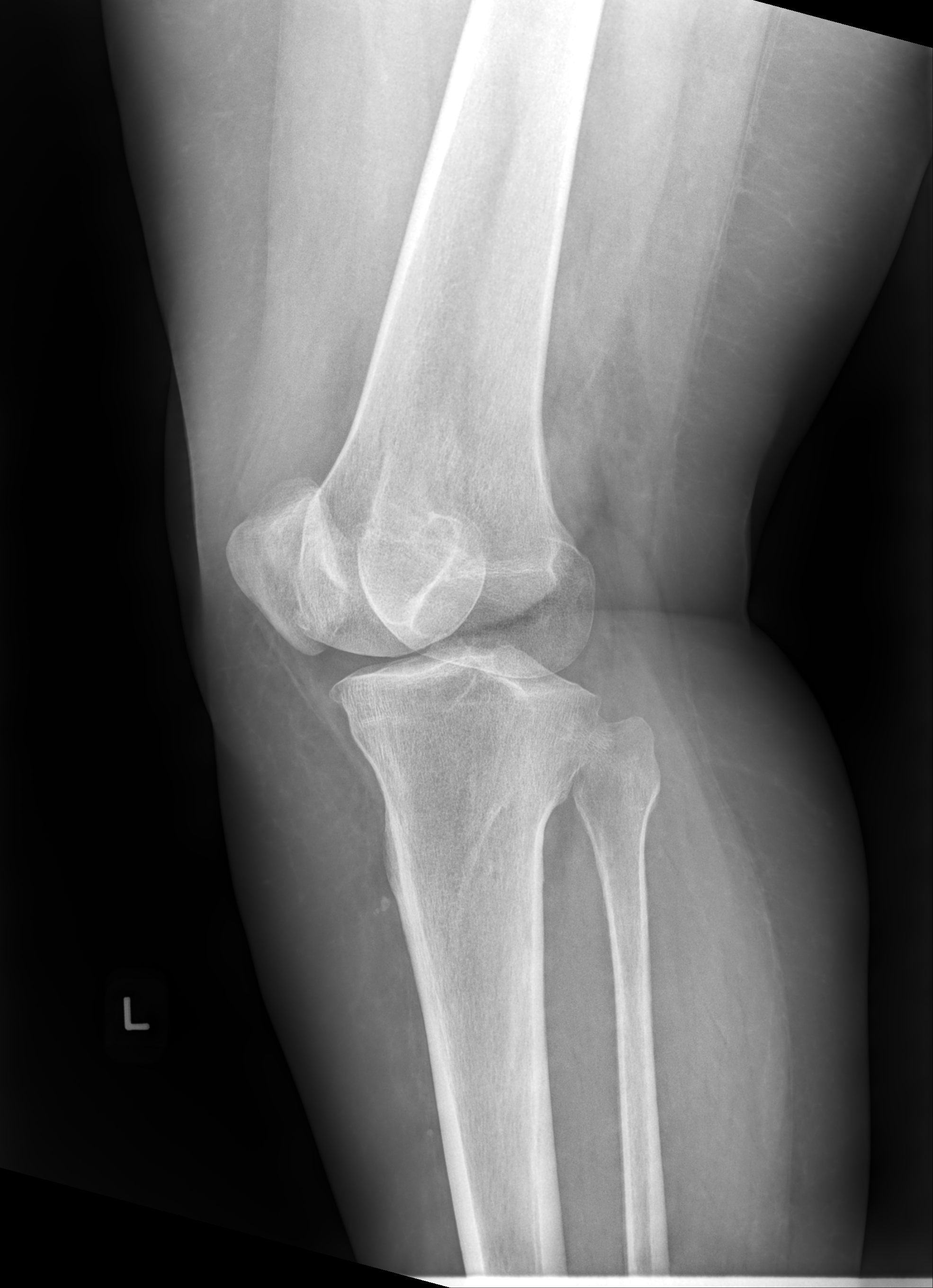

[knee lat]
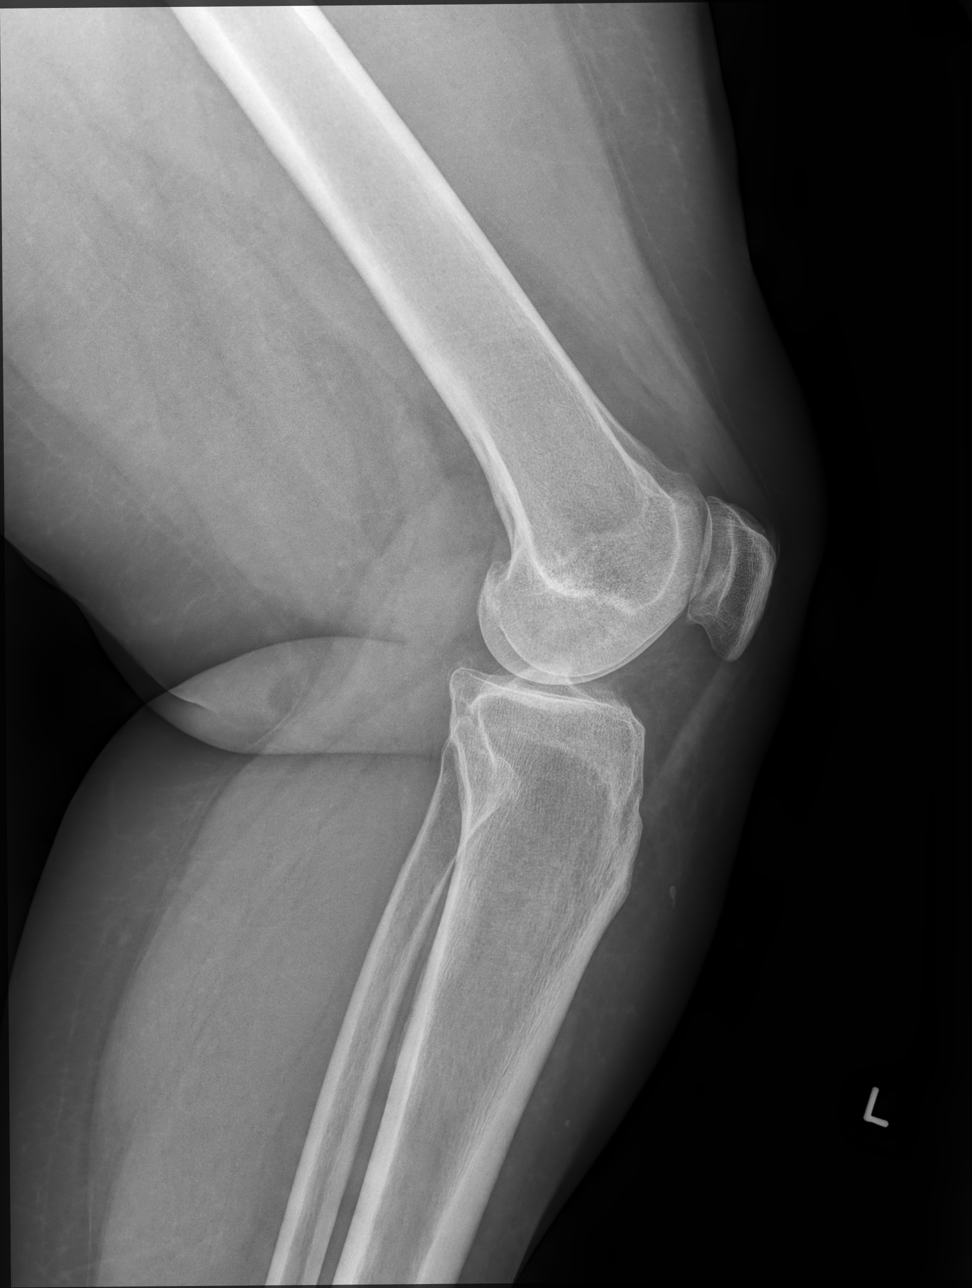

[4 of 4 positions shown; findings below may reference images not displayed]

FINDINGS: There is no acute displaced fracture or dislocation.
Mild-to-moderate tricompartmental degenerative changes are noted.
There is no significant joint effusion. No significant prepatellar
soft tissue swelling.
IMPRESSION: 1. No acute displaced fracture or dislocation.
2. Mild-to-moderate tricompartmental degenerative changes.

## 2021-03-17 ENCOUNTER — Encounter (HOSPITAL_COMMUNITY): Payer: Self-pay | Admitting: Emergency Medicine

## 2021-03-17 ENCOUNTER — Other Ambulatory Visit: Payer: Self-pay

## 2021-03-17 ENCOUNTER — Ambulatory Visit (HOSPITAL_COMMUNITY)
Admission: EM | Admit: 2021-03-17 | Discharge: 2021-03-17 | Disposition: A | Discharge status: Home / Self Care | Payer: 59

## 2021-03-17 DIAGNOSIS — T192XXA Foreign body in vulva and vagina, initial encounter: Secondary | ICD-10-CM | POA: Diagnosis not present

## 2021-03-17 NOTE — ED Triage Notes (Signed)
Pt states that she inserted a yoni pearl in her vaginal canal on 06/01 and tried to take it out 06/03. Pt states that she cannot find the string to take it out. Pt denies any pain.

## 2021-03-17 NOTE — Discharge Instructions (Addendum)
On exam I was unable to retrieve any type of a foreign body.  I would recommend follow-up with your primary care doctor or if you experience any pain or bleeding to go to the emergency department.

## 2021-03-17 NOTE — ED Provider Notes (Signed)
[facility]    CSN: [id number] Arrival date & time: 03/17/21  1836      History   Chief Complaint Chief Complaint  Patient presents with  . Foreign Body in Vagina    HPI Amy Cortez is a 55 y.o. female.   HPI  Patient presents today concerned that she has a foreign body in her vaginal canal.  She uses a vaginal cleansing device called a Carloyn Jaeger and she has been unable to retrieve it and is concerned that the foreign body remains in her vaginal canal.   Past Medical History:  Diagnosis Date  . Kidney stones     There are no problems to display for this patient.   Past Surgical History:  Procedure Laterality Date  . ABDOMINAL HYSTERECTOMY      OB History   No obstetric history on file.      Home Medications    Prior to Admission medications   Medication Sig Start Date End Date Taking? Authorizing Provider  acetaminophen (TYLENOL 8 HOUR) 650 MG CR tablet Take 1 tablet (650 mg total) by mouth every 8 (eight) hours as needed for pain. 12/28/19   Avegno, Zachery Dakins, FNP  albuterol (VENTOLIN HFA) 108 (90 Base) MCG/ACT inhaler Inhale 1-2 puffs into the lungs every 6 (six) hours as needed for wheezing or shortness of breath. 08/08/20   Avegno, Zachery Dakins, FNP  benzonatate (TESSALON) 100 MG capsule Take 1 capsule (100 mg total) by mouth every 8 (eight) hours. 06/01/20   Dahlia Byes A, NP  cetirizine (ZYRTEC) 10 MG tablet Take 1 tablet (10 mg total) by mouth daily. 06/01/20   Dahlia Byes A, NP  Cyanocobalamin (VITAMIN B 12 PO) Take 1,000 mg by mouth daily.    [provider]  FISH OIL-KRILL OIL PO Take 1 capsule by mouth 3 (three) times a week.    [provider]  fluticasone (FLONASE) 50 MCG/ACT nasal spray Place 1 spray into both nostrils daily for 14 days. 11/06/19 11/20/19  Avegno, Zachery Dakins, FNP  lidocaine (XYLOCAINE) 2 % solution Use as directed 15 mLs in the mouth or throat as needed for mouth pain. 08/08/20   Avegno, Zachery Dakins, FNP   predniSONE (DELTASONE) 10 MG tablet Take 2 tablets (20 mg total) by mouth daily. 08/08/20   Avegno, Zachery Dakins, FNP  TURMERIC PO Take 1 capsule by mouth daily.    [provider]  vitamin C (ASCORBIC ACID) 250 MG tablet Take 500 mg by mouth daily.    [provider]  vitamin E (VITAMIN E) 400 UNIT capsule Take 400 Units by mouth 3 (three) times a week.    [provider]  [employer], Dioscorea villosa, (WILD YAM PO) Take 1 tablet by mouth 3 (three) times a week.    [provider]    Family History History reviewed. No pertinent family history.  Social History Social History   Tobacco Use  . Smoking status: Never Smoker  . Smokeless tobacco: Never Used  Vaping Use  . Vaping Use: Never used  Substance Use Topics  . Alcohol use: No  . Drug use: No     Allergies   Celebrex [celecoxib]   Review of Systems Review of Systems Pertinent negatives listed in HPI Physical Exam Triage Vital Signs ED Triage Vitals [03/17/21 1917]  Enc Vitals Group     BP 130/66     Pulse Rate 63     Resp 18     Temp (!)  97.4 F (36.3 C)     Temp src      SpO2 100 %     Weight      Height      Head Circumference      Peak Flow      Pain Score 0     Pain Loc      Pain Edu?      Excl. in GC?    No data found.  Updated Vital Signs BP 130/66   Pulse 63   Temp (!) 97.4 F (36.3 C)   Resp 18   SpO2 100%   Visual Acuity Right Eye Distance:   Left Eye Distance:   Bilateral Distance:    Right Eye Near:   Left Eye Near:    Bilateral Near:     Physical Exam Exam conducted with a chaperone present.  Cardiovascular:     Rate and Rhythm: Normal rate.  Pulmonary:     Effort: Pulmonary effort is normal.     Breath sounds: Normal breath sounds.  Genitourinary:    Exam position: Lithotomy position.     Comments: No FB retrieved.   Musculoskeletal:     Cervical back: Normal range of motion.  Skin:    Capillary Refill: Capillary refill takes less  than 2 seconds.  Neurological:     Mental Status: She is alert.  Psychiatric:        Mood and Affect: Mood normal.        Behavior: Behavior normal.        Thought Content: Thought content normal.        Judgment: Judgment normal.    UC Treatments / Results  Labs (all labs ordered are listed, but only abnormal results are displayed) Labs Reviewed - No data to display  EKG   Radiology No results found.  Procedures Procedures (including critical care time)  Medications Ordered in UC Medications - No data to display  Initial Impression / Assessment and Plan / UC Course  I have reviewed the triage vital signs and the nursing notes.  Pertinent labs & imaging results that were available during my care of the patient were reviewed by me and considered in my medical decision making (see chart for details).     Concern for foreign body in vagina. No FB seen or retrieved.  Advised if she experiences any vaginal pain or discomfort, go to the ER or follow-up with PCP. Final Clinical Impressions(s) / UC Diagnoses   Final diagnoses:  FB vulva/vagina, initial encounter     Discharge Instructions     On exam I was unable to retrieve any type of a foreign body.  I would recommend follow-up with your primary care doctor or if you experience any pain or bleeding to go to the emergency department.   ED Prescriptions    None     PDMP not reviewed this encounter.   Bing Neighbors, [place] 03/17/21 2054

## 2022-01-19 ENCOUNTER — Other Ambulatory Visit: Payer: Self-pay | Admitting: Obstetrics and Gynecology

## 2022-01-19 DIAGNOSIS — Z1231 Encounter for screening mammogram for malignant neoplasm of breast: Secondary | ICD-10-CM

## 2022-02-06 ENCOUNTER — Ambulatory Visit: Payer: 59
